# Patient Record
Sex: Female | Born: 1960 | Race: White | Hispanic: No | Marital: Single | Smoking: Current some day smoker
Health system: Southern US, Community
[De-identification: ages and names within clinical notes are randomized; demographics above are authoritative.]

## PROBLEM LIST (undated history)

## (undated) DIAGNOSIS — F419 Anxiety disorder, unspecified: Secondary | ICD-10-CM

## (undated) DIAGNOSIS — E079 Disorder of thyroid, unspecified: Secondary | ICD-10-CM

## (undated) DIAGNOSIS — F329 Major depressive disorder, single episode, unspecified: Secondary | ICD-10-CM

## (undated) DIAGNOSIS — F32A Depression, unspecified: Secondary | ICD-10-CM

## (undated) DIAGNOSIS — R569 Unspecified convulsions: Secondary | ICD-10-CM

## (undated) DIAGNOSIS — K219 Gastro-esophageal reflux disease without esophagitis: Secondary | ICD-10-CM

## (undated) DIAGNOSIS — M549 Dorsalgia, unspecified: Secondary | ICD-10-CM

## (undated) DIAGNOSIS — I10 Essential (primary) hypertension: Secondary | ICD-10-CM

## (undated) HISTORY — PX: ABDOMINAL HYSTERECTOMY: SHX81

## (undated) HISTORY — PX: MEDIAL PARTIAL KNEE REPLACEMENT: SHX5965

## (undated) HISTORY — PX: TONSILLECTOMY: SUR1361

## (undated) HISTORY — PX: APPENDECTOMY: SHX54

---
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
## 1999-02-26 ENCOUNTER — Emergency Department (HOSPITAL_COMMUNITY): Admission: EM | Admit: 1999-02-26 | Discharge: 1999-02-26 | Payer: Self-pay | Admitting: Emergency Medicine

## 1999-02-27 ENCOUNTER — Encounter: Payer: Self-pay | Admitting: Emergency Medicine

## 1999-08-06 ENCOUNTER — Encounter: Payer: Self-pay | Admitting: Emergency Medicine

## 1999-08-06 ENCOUNTER — Emergency Department (HOSPITAL_COMMUNITY): Admission: EM | Admit: 1999-08-06 | Discharge: 1999-08-06 | Payer: Self-pay | Admitting: Emergency Medicine

## 1999-09-05 ENCOUNTER — Inpatient Hospital Stay (HOSPITAL_COMMUNITY): Admission: EM | Admit: 1999-09-05 | Discharge: 1999-09-09 | Payer: Self-pay | Admitting: *Deleted

## 1999-09-12 ENCOUNTER — Other Ambulatory Visit: Admission: RE | Admit: 1999-09-12 | Discharge: 1999-10-19 | Payer: Self-pay | Admitting: Psychiatry

## 2000-02-10 ENCOUNTER — Emergency Department (HOSPITAL_COMMUNITY): Admission: EM | Admit: 2000-02-10 | Discharge: 2000-02-10 | Payer: Self-pay | Admitting: Emergency Medicine

## 2000-02-13 ENCOUNTER — Other Ambulatory Visit (HOSPITAL_COMMUNITY): Admission: RE | Admit: 2000-02-13 | Discharge: 2000-03-01 | Payer: Self-pay | Admitting: Psychiatry

## 2000-03-03 ENCOUNTER — Emergency Department (HOSPITAL_COMMUNITY): Admission: EM | Admit: 2000-03-03 | Discharge: 2000-03-03 | Payer: Self-pay | Admitting: Emergency Medicine

## 2000-03-14 ENCOUNTER — Ambulatory Visit (HOSPITAL_COMMUNITY): Admission: RE | Admit: 2000-03-14 | Discharge: 2000-03-14 | Payer: Self-pay | Admitting: Gastroenterology

## 2000-06-22 ENCOUNTER — Encounter: Payer: Self-pay | Admitting: Gastroenterology

## 2000-06-22 ENCOUNTER — Ambulatory Visit (HOSPITAL_COMMUNITY): Admission: RE | Admit: 2000-06-22 | Discharge: 2000-06-22 | Payer: Self-pay | Admitting: Gastroenterology

## 2002-03-17 ENCOUNTER — Emergency Department (HOSPITAL_COMMUNITY): Admission: EM | Admit: 2002-03-17 | Discharge: 2002-03-17 | Payer: Self-pay | Admitting: Emergency Medicine

## 2002-06-04 ENCOUNTER — Emergency Department (HOSPITAL_COMMUNITY): Admission: EM | Admit: 2002-06-04 | Discharge: 2002-06-04 | Payer: Self-pay | Admitting: Emergency Medicine

## 2004-05-03 ENCOUNTER — Emergency Department (HOSPITAL_COMMUNITY): Admission: EM | Admit: 2004-05-03 | Discharge: 2004-05-04 | Payer: Self-pay | Admitting: Emergency Medicine

## 2004-05-18 ENCOUNTER — Ambulatory Visit: Payer: Self-pay | Admitting: Psychiatry

## 2004-05-18 ENCOUNTER — Inpatient Hospital Stay (HOSPITAL_COMMUNITY): Admission: RE | Admit: 2004-05-18 | Discharge: 2004-05-20 | Payer: Self-pay | Admitting: Psychiatry

## 2004-06-08 ENCOUNTER — Emergency Department (HOSPITAL_COMMUNITY): Admission: EM | Admit: 2004-06-08 | Discharge: 2004-06-08 | Payer: Self-pay | Admitting: Emergency Medicine

## 2004-11-20 ENCOUNTER — Emergency Department (HOSPITAL_COMMUNITY): Admission: EM | Admit: 2004-11-20 | Discharge: 2004-11-20 | Payer: Self-pay | Admitting: Emergency Medicine

## 2004-12-06 ENCOUNTER — Emergency Department (HOSPITAL_COMMUNITY): Admission: EM | Admit: 2004-12-06 | Discharge: 2004-12-06 | Payer: Self-pay | Admitting: Emergency Medicine

## 2005-09-07 ENCOUNTER — Emergency Department (HOSPITAL_COMMUNITY): Admission: EM | Admit: 2005-09-07 | Discharge: 2005-09-08 | Payer: Self-pay | Admitting: Emergency Medicine

## 2005-09-08 ENCOUNTER — Inpatient Hospital Stay (HOSPITAL_COMMUNITY): Admission: EM | Admit: 2005-09-08 | Discharge: 2005-09-14 | Payer: Self-pay | Admitting: Emergency Medicine

## 2006-05-21 ENCOUNTER — Encounter: Admission: RE | Admit: 2006-05-21 | Discharge: 2006-05-21 | Payer: Self-pay | Admitting: Family Medicine

## 2008-01-28 IMAGING — CT CT HEAD W/O CM
1 of 2 series · 13 of 30 positions shown, 17 images · IV contrast (agent unspecified)
Comparison: none

CLINICAL DATA: Headache and blurred vision.  Left arm weakness.  Nausea.  Seizure disorder.  
 HEAD CT WITHOUT CONTRAST:
TECHNIQUE: Contiguous axial images were obtained from the base of the skull through the vertex according to standard protocol without contrast.

[Series 2: brain · axial · 0.47mm/px · z∈[+106,+230]mm · 13 of 28 slices shown, 17 images]
[im 2/28  brain]
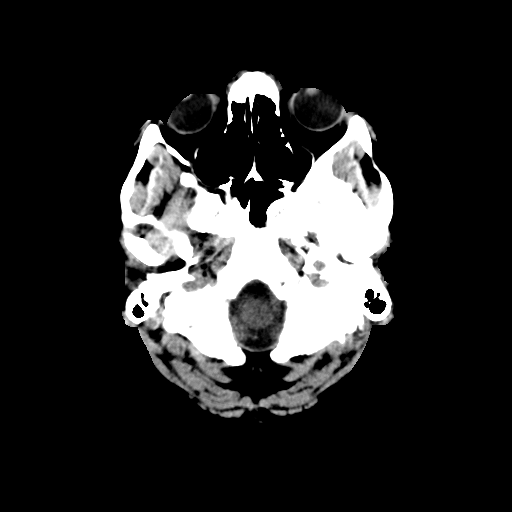
[im 2/28  bone]
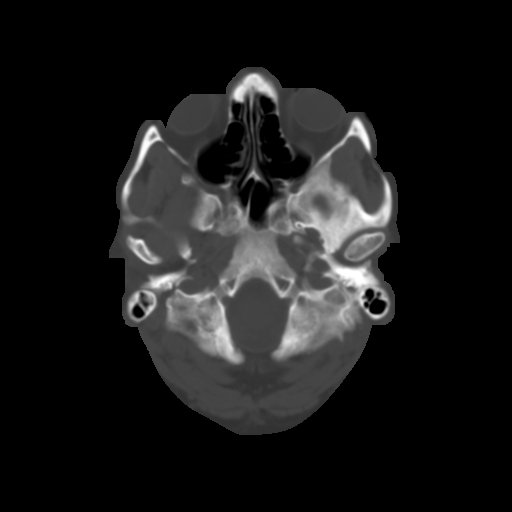
[im 4/28  brain]
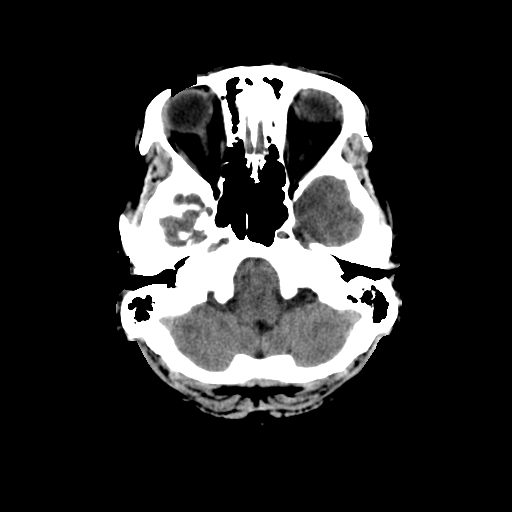
[im 6/28  brain]
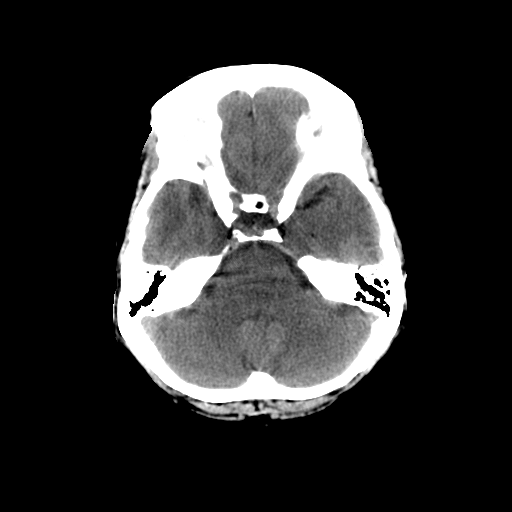
[im 8/28  brain]
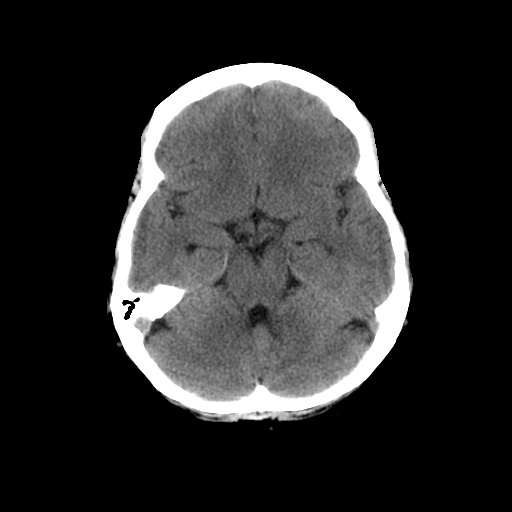
[im 10/28  brain]
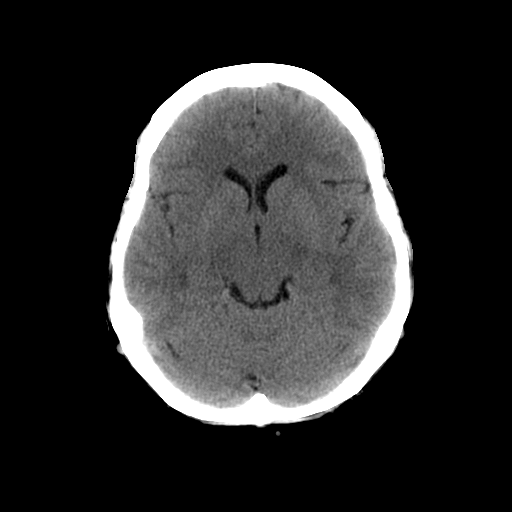
[im 10/28  bone]
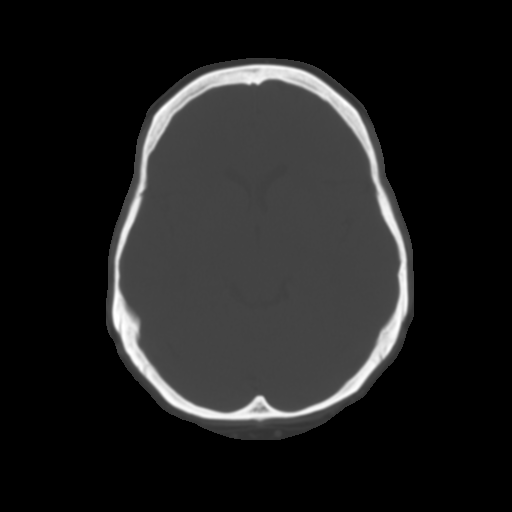
[im 12/28  brain]
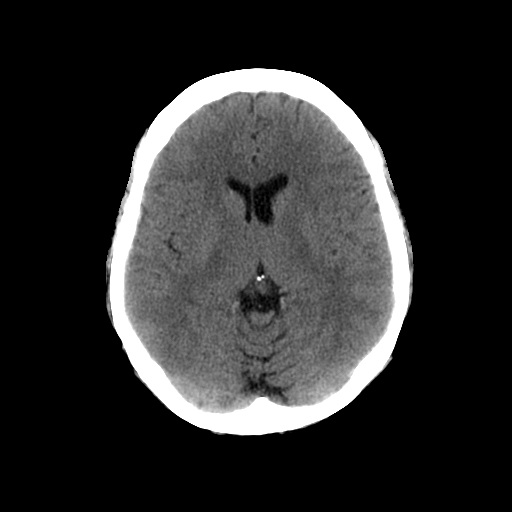
[im 14/28  brain]
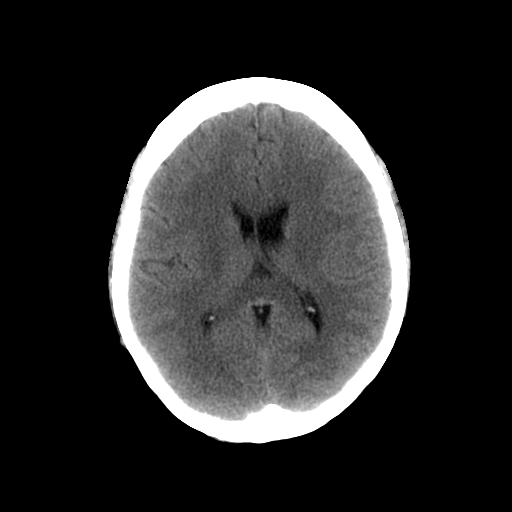
[im 16/28  brain]
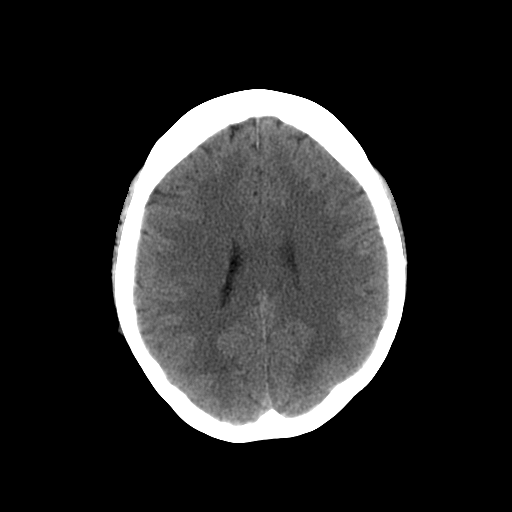
[im 18/28  brain]
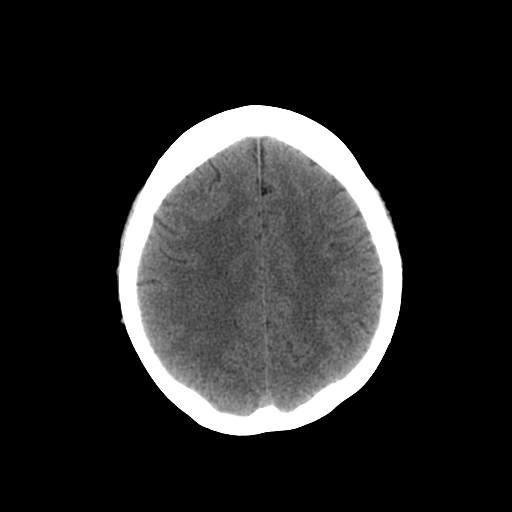
[im 18/28  bone]
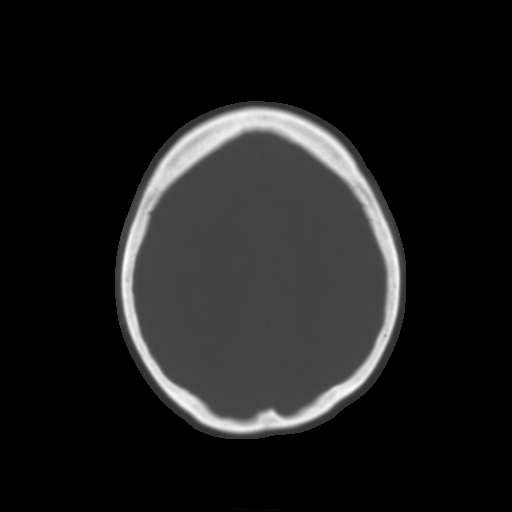
[im 20/28  brain]
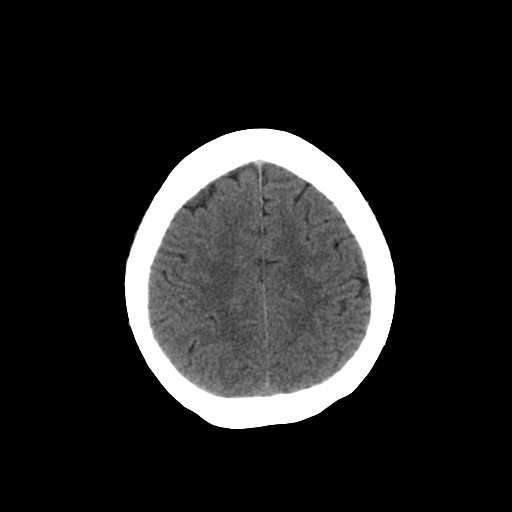
[im 22/28  brain]
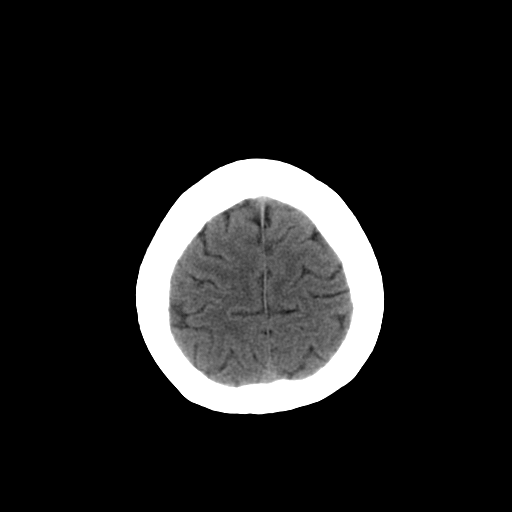
[im 24/28  brain]
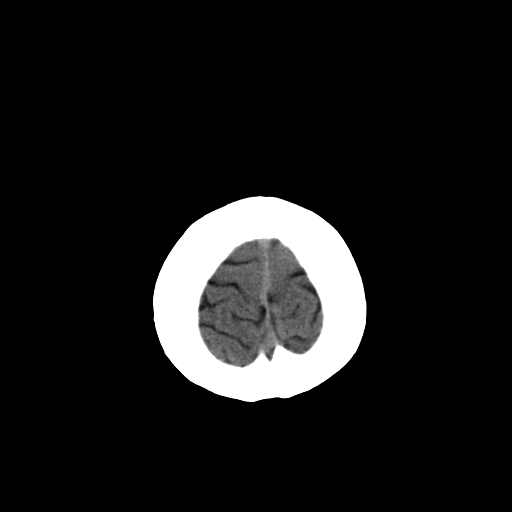
[im 26/28  brain]
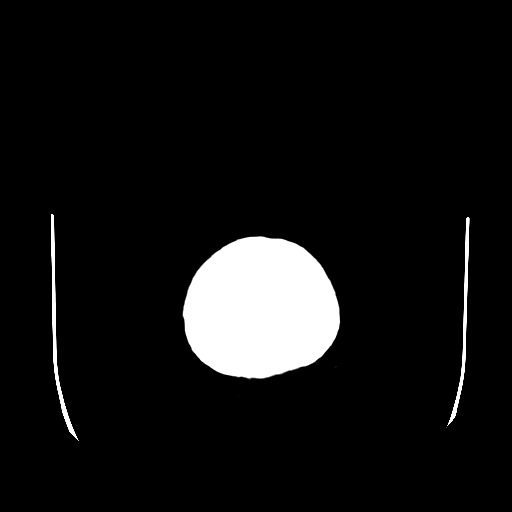
[im 26/28  bone]
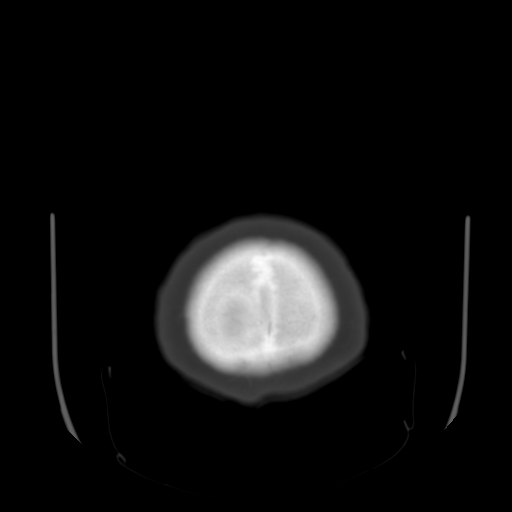

[13 of 30 positions shown; findings below may reference images not displayed]

FINDINGS: There is no evidence of intracranial hemorrhage, brain edema, acute 
 infarct, mass lesion, or mass effect.  No other intra-axial abnormalities 
 are seen, and the ventricles are within normal limits.  No abnormal 
 extra-axial fluid collections or masses are identified.  No skull 
 abnormalities are noted.
IMPRESSION: Negative non-contrast head CT.

## 2008-03-03 ENCOUNTER — Ambulatory Visit (HOSPITAL_BASED_OUTPATIENT_CLINIC_OR_DEPARTMENT_OTHER): Admission: RE | Admit: 2008-03-03 | Discharge: 2008-03-03 | Payer: Self-pay | Admitting: Urology

## 2008-03-03 ENCOUNTER — Encounter (INDEPENDENT_AMBULATORY_CARE_PROVIDER_SITE_OTHER): Payer: Self-pay | Admitting: Urology

## 2008-12-13 ENCOUNTER — Emergency Department (HOSPITAL_BASED_OUTPATIENT_CLINIC_OR_DEPARTMENT_OTHER): Admission: EM | Admit: 2008-12-13 | Discharge: 2008-12-13 | Payer: Self-pay | Admitting: Emergency Medicine

## 2008-12-13 ENCOUNTER — Ambulatory Visit: Payer: Self-pay | Admitting: Diagnostic Radiology

## 2010-05-31 NOTE — Op Note (Signed)
NAME:  [name], [name]                   ACCOUNT NO.:  [id number]   MEDICAL RECORD NO.:  [id number]          PATIENT TYPE:  AMB   LOCATION:  NESC                         FACILITY:  [facility]   PHYSICIAN:  Lindaann Slough, M.D.  DATE OF BIRTH:  [birth year]   DATE OF PROCEDURE:  03/03/2008  DATE OF DISCHARGE:                               OPERATIVE REPORT   PREOPERATIVE DIAGNOSES:  Pelvic pain and interstitial cystitis.   POSTOPERATIVE DIAGNOSES:  Pelvic pain and interstitial cystitis, meatal  stenosis.   PROCEDURE:  Meatal dilation and cystoscopy, HOD and bladder biopsy and  instillation of Marcaine and Pyridium in the bladder.   SURGEON:  Danae Chen, M.D.   ANESTHESIA:  General.   INDICATION:  The patient is a 50 years old female who has been  complaining of frequency, suprapubic discomfort, nocturia x2 to 3 and  occasional episodes of enuresis.  CT scan was unremarkable.  She is  scheduled today for cystoscopy, hydraulic bladder distention, bladder  biopsy and bladder instillation of Marcaine and Pyridium.   The patient was identified by her wrist band and proper time-out was  taken.   DESCRIPTION OF PROCEDURE:  Under general anesthesia she was prepped and  draped and placed in the dorsal lithotomy position.  A panendoscope  could not be inserted in the bladder because of meatal stenosis.  The  meatus was then dilated up to #30 [place] with female sounds then the  cystoscope was inserted without difficulty in the bladder.  The bladder  mucosa is reddened.  There is no stone or tumor in the bladder.  The  ureteral orifices are in normal position and shape with clear efflux.  The bladder was then distended for about 10 minutes under 36 cm of  water.  Then there was evidence glomerulations  after bladder  distention.  The bladder capacity is about 600 mL.  Then with a cold cup  biopsy forceps, a random bladder biopsy was done.  The areas of biopsy  were then fulgurated with the Bugbee  electrode.  The bladder was then  emptied and the cystoscope removed.  Then 400 mg of Pyridium in 15 mL of  0.5% Marcaine were instilled in the bladder.   The patient tolerated the procedure well and left the OR in satisfactory  condition to post anesthesia care unit.      Lindaann Slough, M.D.  Electronically Signed    MN/MEDQ  D:  03/03/2008  T:  03/03/2008  Job:  [id number]   cc:   Burna Mortimer, MD

## 2010-06-03 NOTE — H&P (Signed)
NAME:  [name], [name]                   ACCOUNT NO.:  [id number]   MEDICAL RECORD NO.:  [id number]          PATIENT TYPE:  INP   LOCATION:  1825                         FACILITY:  [facility]   PHYSICIAN:  Kela Millin, M.D.DATE OF BIRTH:  1960-12-22   DATE OF ADMISSION:  09/08/2005  DATE OF DISCHARGE:                                HISTORY & PHYSICAL   CHIEF COMPLAINT:  Persistent headache and neck stiffness.   HISTORY OF PRESENT ILLNESS:  The patient is a 50 year old white female who  presents with persistent headaches and neck pain.  She had presented to the  ER on the day prior to this admission with complaints of severe headache and  a workup including a CT scan of the head and lumbar puncture was done and  the patient was diagnosed with viral meningitis and was discharged home on  Dilaudid and Phenergan.  The patient states that she had also been having  some left arm numbness when she initially presented and a CT scan of her  head done in the ER was negative for any acute intracranial findings.  The  patient's lumbar puncture showed 18 WBCs with 93% lymphocytes per the ER  physician.  The patient returns to the ER today with similar complaints -  stating that the headache is just all over her head and feels like severe  pressure and that she also feels like her neck is stiff.  She states that  the Dilaudid she was discharged on made her itch and so she stopped taking  it.  She denies fevers, nausea, vomiting, dysphagia, slurred speech, facial  droop, chest pain, diarrhea, melena, and no hematochezia.   The patient was seen in the ER and [facility] Spotted Fever test from  August 23 was reported to be negative.  The Lyme test was also negative.  The patient has reported a question of a tick bite a couple of weeks ago.  She is admitted to the [facility] service for further evaluation and  management.   PAST MEDICAL HISTORY:  1. Bipolar disorder.  2. History of seizure  disorder.  3. History of suicidal ideations with BAC admission.  4. History of chronic back pain.   MEDICATIONS:  1. Lamictal 200 mg 2 p.o. q.h.s.  2. Inderal 60 mg 1 daily.  3. Seroquel 100 mg p.o. q.h.s.  4. Clonazepam 1 mg t.i.d. p.r.n.  5. Wellbutrin XL 150 mg daily.  6. Nexium 40 mg daily.  7. Dilaudid.  8. Phenergan.   ALLERGIES/REACTIONS:  ANTIHISTAMINES cause hives and itching, DILAUDID  causes itching.   SOCIAL HISTORY:  She states that she quit tobacco three weeks ago after  smoking for nine years, occasional alcohol.   FAMILY HISTORY:  Mother coronary artery disease in her 12s status post CABG.  Father heart disease diagnosed in early 65s and has depression.  Sister with  breast cancer and depression.  A brother with heart disease diagnosed at age  90, also had hypertension.   REVIEW OF SYMPTOMS:  As per HPI, other comprehensive review of systems  negative.  PHYSICAL EXAMINATION:  GENERAL:  The patient is a middle aged white female, alert and oriented in  no apparent distress.  Appears non-toxic.  VITAL SIGNS:  Temperature 97.5, blood pressure 126/83, pulse 72, respiratory  rate 20, O2 saturation 97%.  HEENT:  PERL, EOMI, sclerae anicteric, moist mucous membranes, no oral  exudate.  NECK:  Supple, there is no meningismus, no adenopathy, no thyromegaly.  LUNGS:  Clear to auscultation bilaterally, no crackles or wheezes.  CARDIOVASCULAR:  Normal S1 and S2, regular rate and rhythm.  ABDOMEN:  Soft, bowel sounds present, nontender, nondistended, no  organomegaly, no masses palpable.  EXTREMITIES:  No clubbing, no edema.  NEUROLOGICAL:  Alert and oriented x 3, cranial nerves 2-12 grossly intact.  Strength is 5/5 and symmetric.  Sensory is grossly intact.  Nonfocal exam.   LABORATORY DATA:  CSF colorless, 18 WBCs, 3 lymphocytes, CSF glucose 67, CSF  protein 41.  [facility] Spotted Fever test is negative.  CT scan of  head from August 23 negative for acute  intracranial findings.   ASSESSMENT/PLAN:  1. Probable viral meningitis - I will add VDRL to CSF fluid.  Start      empiric Acyclovir.  Follow pending Lyme test.  Follow with imaging      studies as appropriate.  Will also obtain serial HIV and follow.  2. Bipolar disorder - continue outpatient medications.  3. GERD - continue PPI.  4. History of seizure disorder - continue outpatient medications.      Kela Millin, M.D.  Electronically Signed     ACV/MEDQ  D:  09/08/2005  T:  09/08/2005  Job:  [id number]

## 2010-06-03 NOTE — H&P (Signed)
[facility]  Patient:    [name], [name]                            MRN: [id number] Adm. Date:  [id number] Disc. Date: [id number] Attending:  Denny Peon                   Psychiatric Admission Assessment  IDENTIFICATION:  Rolla Lovick is a 50 year old white separated female.  Patient was admitted for her first psychiatric admission.  REASON FOR ADMISSION:  Symptoms:  Patient was admitted for voluntary commitment with suicidal plan to overdose.  She felt being at the end of her rope.  Complains of severe middle, initial, and terminal insomnia, severe anxiety attacks, almost on a daily basis, crying spells, feeling hopeless, helpless, and having at times suicidal thoughts.  One month ago she took a bunch of Xanax tablets, but "nothing happened."  The recent stressors include death of father, who died in 18-Apr-2022 of last year, death of uncle in 09/18/22 less than a year ago.  Less than a year ago her husband left her for another woman. Patient lost her job, and she is on workmens compensation since October of 2000 due to back injury.  She used to work as a Psychologist, forensic.  She feels like there is no money, no future, no hope.  At the time of initial evaluation she still had some suicidal thoughts, but wanted to work it out. Patient reports a decrease in appetite and significant weight loss.  PAST PSYCHIATRIC HISTORY:  Patient was in 1995 for depression and most likely Paxil and Xanax with some help.  She was never hospitalized, but always has had " bad nerves."  SOCIAL HISTORY:  Patient is a high Garment/textile technologist.  She was with her husband since 29 or 6 years old.  Her childhood was troubled by neglect of her family home.  FAMILY HISTORY:  Significant for alcohol abuse and depression of the patients father and sister.  ALCOHOL AND DRUG HISTORY:  Patient smokes occasionally marijuana, maybe every 2-3 months.  She smokes a pack of cigarettes per day.  She  denies using any other drugs, denies drinking.  MEDICAL HISTORY:  Patient is under care of [facility].  She recently was treated for sinus infection.  Historically she had hysterectomy in the past.  CURRENT MEDICATIONS: 1. Paxil 30 mg daily. 2. Xanax 1 mg three times a day.  ALLERGIES:  Patient is allergic to ANTIHISTAMINES.  PHYSICAL EXAMINATION:  Normal in emergency room.  MENTAL STATUS EXAMINATION:  Revealed neat looking white female with tight facial expression, cooperative and quiet during the interview.  Normal speech. Mood was depressed and affect was tearful.  Thoughts were organized and goal directed.  She had suicidal thoughts with no intention to hurt herself.  She also felt hopeless.  No delusions, no ideas of reference, no homicidal thoughts.  No symptoms suggestive for OCD.  Alert and oriented x 3 with good memory, but impaired concentration.  She complains of having jumbled thoughts and having a hard time concentrating.  Patients insight was impaired, judgment fair.  DIAGNOSTIC IMPRESSION: Axis I:    Major depression, recurrent, moderate to severe. Axis II:   No diagnosis. Axis III:  Chronic back pain, status post hysterectomy. Axis IV:   Psychosocial stressors, severe, marital difficulties, economical            problems, and medical problems. Axis V:  Global assessment of functioning at the time of examination 25,            maximum for past year estimated at 55.  PLAN:  Will involve patient in group therapy, place her on special observation, increase dose of Paxil, continue Xanax, and add Zyprexa at bedtime because of racing thoughts.  Will consider referral to partial hospitalization program or intensive outpatient treatment after discharge. DD:  10/05/99 TD:  10/06/99 Job: [id number]

## 2010-06-03 NOTE — Discharge Summary (Signed)
NAME:  [name], [name]                   ACCOUNT NO.:  [id number]   MEDICAL RECORD NO.:  [id number]          PATIENT TYPE:  INP   LOCATION:  4712                         FACILITY:  [facility]   PHYSICIAN:  Kela Millin, M.D.DATE OF BIRTH:  December 14, 1960   DATE OF ADMISSION:  09/08/2005  DATE OF DISCHARGE:  09/14/2005                                 DISCHARGE SUMMARY   DISCHARGE DIAGNOSES:  1. Viral meningitis.  2. Candida vulvovaginitis.  3. Bipolar disorder.  4. History of seizure disorder.  5. History of chronic back pain.  6. History of suicidal ideation with BH, see admission.  7. Gastroesophageal reflux disease.   PROCEDURES AND STUDIES:  1. Blood patch.  2. CSF PCR negative and CSF VDRL negative.  Lyme disease test 0.06,      negative.  3. HIV test negative.  4. Serum RPR negative.   HISTORY:  The patient is a 50 year old white female who presented with  complaint of persistent headaches and neck pain.  She had been to the ER the  day prior to admission with complaint of severe headaches, and the workup  including a CT scan of the head and lumbar puncture was done.  The patient  was diagnosed with viral meningitis and discharged home on Dilaudid and  Phenergan.  The patient reported that she had been having some left arm  numbness as well when she had initially presented.  The CT of the head done  in the ER was negative for any acute intracranial findings.  The CSF from  the lumbar puncture that day showed 18 WBCs with 93% lymphocytes.  The  patient came back to the ER on the day of admission with complaint of  persistent headaches as well as a feeling of neck stiffness.  She reported  that the Dilaudid that she was discharged on had made her itch, and so she  stopped taking it.  She denied fevers, nausea, vomiting, dysphagia, slurred  speech, facial droop, chest pain, diarrhea, melena, and no hematochezia.   The patient was admitted to the [facility] service for  further  evaluation and management.  Her physical exam upon admission revealed a  temperature of 97.5 with a blood pressure of 126/83 and a pulse of 72,  respiratory rate of 20, O2 sat of 97%.  In general, she was a middle-aged  white female, alert and oriented, in no apparent distress, appeared  nontoxic.  Pertinent findings on exam:  Her neck was supple, no meningismus,  no adenopathy and no thyromegaly.  The rest of her physical exam was also  reported to be within normal limits.   LABORATORY DATA:  The CSF was colorless, with 18 WBCs, 93% lymphocytes.  CSF  glucose 67.  CSF protein 41.  [facility] spotted fever test negative.  CT scan of head from August 23 negative for acute intracranial findings.   PROBLEMS:  1. Viral meningitis.  Upon admission the patient had VDRL and herpes PCR      added to her CS fluid already in lab, and she was empirically started  on acyclovir.  She also had an HIV test done and the results as stated      above were negative.  The patient was also placed on analgesics for      pain control.  The patient's headaches seemed to improve initially, but      she had an increase in her creatinine from 1.3 to 1.5 and it was      thought that this was secondary to the NSAIDs, so they were      discontinued.  The patient's headaches began to get worse again.      Spinal headache was questioned.  The patient was sent for a blood patch      procedure.  The patient's headaches have improved significantly.  She      had been placed on lidocaine patch after the NSAIDs were discontinued,      and she said that she no longer needed the lidocaine patches.  The      patient's symptoms have improved.  She is afebrile and clinically      better.  She will be discharged home at this time to follow up with her      primary care physician  at [facility].  The IV acyclovir was      discontinued once her PCR returned negative.  2. Candidal vulvovaginitis.  The patient  complained of vaginal irritation      while she was in the hospital.  She was given a dose of Diflucan and      also placed on clotrimazole topically.  If her symptoms do not improve,      she is to follow up with her primary care physician.  3. GERD.  The patient was maintained on PPI during her hospital stay.  4. Bipolar disorder.  The patient was maintained on her outpatient      medications during her hospital stay.  5. History of seizure disorder.  She remained seizure frequent throughout      her hospital stay.  She was maintained on her outpatient medications.   DISCHARGE MEDICATIONS:  1. Vicodin.  2. Patient to continue preadmission medications except for Dilaudid and      Phenergan.   FOLLOWUP CARE:  [facility] in one week.   DISCHARGE CONDITION:  Improved/stable.      Kela Millin, M.D.  Electronically Signed     ACV/MEDQ  D:  09/14/2005  T:  09/14/2005  Job:  [id number]

## 2010-06-03 NOTE — Discharge Summary (Signed)
[facility]  Patient:    [name]                            MRN: [id number] Adm. Date:  [id number] Disc. Date: [id number] Attending:  Denny Peon                           Discharge Summary  INTRODUCTION:  [name] is a 50 year old white separated female, mother of two adult children.  She was admitted on voluntary papers because of suicidal plan to overdose on medication.  She complained of insomnia, anxiety, crying spells, feeling hopeless and helpless, having suicidal thoughts and not being able to promise safety.  The results of admission circumstances are available in the admission note.  HOSPITAL COURSE:  After admission to the [facility], the patient was place don special observation.  She was started on Paxil 30 mg at bedtime which later was switched to 40 mg daily, Xanax 1 mg three times a day.  Because of a sinus infection she was started on Amoxil 500 mg three times a day.  While in the hospital, I had to start her on Zyprexa 2.5 mg at bedtime due to racing thoughts and inability to fall asleep.  The 2.5 mg proved to be too strong a dose for this woman and dose was reduced to 1.5 mg at night with good result. as hospitalization proceeded, the patient revealed more historical data, suggestive for some signs of bipolarity.  I started her on Depakote ER, 500 mg at bedtime which the patient finds very helpful.  On September 09, 1999 she presented with a bright pleasant affect.  She had a good visit with her daughter last night and signals from home were that she was doing very well. The patient denies any dangerous ideations.  She did not have any side effects from medication and the decision was made to discharge her home with recommendation for outpatient treatment at partial hospitalization program.  MEDICAL PROBLEMS:  During this hospitalization, the patient did not have any medical problems except for a sinus infection which was treated.   Vital signs were stable.  The results of blood work show normal comprehensive metabolic panel, normal CBC, normal thyroid function tests.  DISCHARGE DIAGNOSES: Axis I:    Bipolar II disorder, depressed, moderate to severe. Axis II:   No diagnosis. Axis III:  1. Chronic back pain.            2. Status post hysterectomy.            3. Sinus infection. Axis IV:   Moderate stressors, of economical nature and medical problems. Axis V:    Global assessment of functioning upon admission 25, upon discharge            50, maximum 55.  DISCHARGE RECOMMENDATION:  The patient received a prescription for Paxil 40 mg to take in the morning and half at bedtime, Zyprexa 2.5 mg half at bedtime x 2 weeks then discontinue, Depakote ER, 500 mg one at night, Xanax 1 mg three times a day and Amoxil 500 mg three times a day for five days.  The patient will be excused from work at least until September 25, 1999.  The patient should call mental health if duration of depression, problems with medication. Will check blood work, especially Depakote level and liver panel in the next two weeks.  Follow up  on an outpatient basis at partial hospitalization program, starting Monday September 12, 1999 at 9 a.m. with future follow-up to be established by partial hospitalization staff.  The patient understood the instructions and possible side effects from medications, which were explained to her.  She was discharged in good condition in care of her family. DD:  09/09/99 TD:  09/11/99 Job: [id number]

## 2010-06-03 NOTE — Discharge Summary (Signed)
NAME:  [name], [name]                   ACCOUNT NO.:  [id number]   MEDICAL RECORD NO.:  [id number]          PATIENT TYPE:  IPS   LOCATION:  0504                          FACILITY:  [facility]   PHYSICIAN:  Jeanice Lim, M.D. DATE OF BIRTH:  [birth year]   DATE OF ADMISSION:  05/18/2004  DATE OF DISCHARGE:  05/20/2004                                 DISCHARGE SUMMARY   IDENTIFYING DATA:  This 50 year old divorced Caucasian female voluntarily  admitted, referred by primary care physician for possible suicide thoughts  and plan to overdose on medications, had been depressed and feeling lonely  for the past 6 months with some vague thoughts of overdosing off and on.  Endorsed anhedonia, lethargy, lying in bed 12 hours a day or more.  Broken  sleep, but denied agitation, had been put on Zyprexa for sleep 1 year ago.  She has gained 20 pounds.   PAST PSYCHIATRIC HISTORY:  [facility] admission.  At  [facility] in 1999.  IOP in 1999 for depression, no prior actual suicide  attempts, prior suicidality on Xanax at age 53 and 57.  Denied a history of  abuse.  Denied history of childhood abuse.  Divorced since 1999.  Trained as  a Lawyer.  Medicaid helps her afford medications.   MEDICATIONS:  1.  Paxil.  2.  Zyprexa.  3.  Prevacid.  4.  Tussionex.  5.  Wellbutrin.  6.  Klonopin.  7.  Xanax 0.5 twice a day.  8.  Dilantin 100 mg 2 in the morning and 3 at night.   PHYSICAL EXAMINATION:  Her physical and neurologic exam within normal  limits.   ROUTINE ADMISSION LABS:  Essentially within normal limits.   DRUG ALLERGIES:  ANTIHISTAMINES.   MENTAL STATUS EXAM:  Fully alert, bright affect, cooperative, pleasant.  Depressed.  Thought processes goal directed, logical.  Positive suicidal  ideations.  Contracting for safety only in the hospital.  Cognitively  intact.  Judgment and insight were impaired.   ADMISSION DIAGNOSES:   AXIS I:  Major depressive disorder, recurrent, severe.   AXIS II:  None.   AXIS III:  1.  Gastroesophageal reflux disease.  2.  Costochondritis.  3.  History of seizure disorder.   AXIS IV:  Moderate stressors related to limited support system.   AXIS V:  35/65.   HOSPITAL COURSE:  Patient was admitted, ordered routine p.r.n. medications,  underwent further monitoring, was encouraged to participate in individual,  group and milieu therapy.  Hemoglobin A1c, lipid panel, Dilantin level,  CMET, CBC and all other routine labs to rule out any reversible organic  etiology of her psychopathology and monitored safety and medications ordered  including baseline labs.  Patient felt that she did not need to stay that  she was not suicidal, and she was tolerating medications and mood had  improved.  She did not feel unsafe and ready for discharge with no acute  risk issues, no psychotic symptoms and tolerating medicines well.  Sleeping  and eating well.  Appropriate on the unit.  Patient was discharged  again  with improved mood stabilization and brighter affect, possibly improved  insight and coping skills.  Again given medication education regarding risk,  benefits and all side effects, significant and common.   DISCHARGE MEDICATIONS:  She was discharged on:  1.  Xanax 0.5 b.i.d.  2.  Dilantin 100 mg 2 in the morning and 3 at night.  3.  Paxil 20 mg 2 daily.  4.  Klonopin 1 mg q.a.m. and 2 mg q.h.s.  5.  Ambien 10 mg q.h.s.  6.  Protonix 40 mg daily.   FOLLOW UP:  Patient was discharged to follow up with [facility], walk-in to complete paperwork Monday to Friday 9:00 a.m. to 4:00  p.m.   DISCHARGE DIAGNOSES:   AXIS I:  Major depressive disorder, recurrent, severe.   AXIS II:  None.   AXIS III:  1.  Gastroesophageal reflux disease.  2.  Costochondritis.  3.  History of seizure disorder.   AXIS IV:  Moderate stressors related to limited support system.   AXIS V:  Global assessment of function on discharge 55.   Patient was  improved.      JEM/MEDQ  D:  06/26/2004  T:  06/26/2004  Job:  [id number]

## 2010-06-03 NOTE — H&P (Signed)
NAME:  [name], [name]                   ACCOUNT NO.:  [id number]   MEDICAL RECORD NO.:  [id number]          PATIENT TYPE:  IPS   LOCATION:  0504                          FACILITY:  [facility]   PHYSICIAN:  Jeanice Lim, M.D. DATE OF BIRTH:  1960-01-27   DATE OF ADMISSION:  05/18/2004  DATE OF DISCHARGE:                         PSYCHIATRIC ADMISSION ASSESSMENT   IDENTIFYING INFORMATION:  This is a 50 year old white female who is  divorced.  This is a voluntary admission.  She goes by the name of [name].   HISTORY OF PRESENT ILLNESS:  This patient was referred by her primary care  physician, Dr. Fleet Contras, at the [facility] in [place] she presented there for the first visit.  She went  there for complaints of chronic bronchitis and costochondritis but, at that  time, also described her chronic depression, which has persisted for the  past six months and her intermittent thoughts of possibly overdosing on her  medications.  At that point, she was referred for psychiatric admission.  She has a history of depression with initial onset five years ago.  Most  recently, over the past six months, she reports her depression has gotten  worse and endorses anhedonia, lethargy, laying in bed approximately 12+  hours a day but not sleeping well at night.  She denies any agitation.  Denies hallucinations.  Denies any homicidal thoughts.  She had some  occasional thoughts of overdosing on medications with the last episode being  about two weeks ago when her daughter got upset with her and they argued.  When asked how she coped with these thoughts, she said I called my daughter  back and we talked some more.  The patient was placed on Paxil 30 mg  approximately one year ago, which she takes regularly and, on Zyprexa 10 mg  for sleep.  She denies any history of thought agitation or hallucinations.  She does report gaining approximately 20 pounds in the last year since  starting on the Zyprexa.   PAST PSYCHIATRIC HISTORY:  The patient has no current psychiatric [name].  She is receiving medications on a prescription of a previous physician in  [place], probably actually more than one physician.  This is her first  admission to [facility].  She has a history of  being hospitalized and doing intensive outpatient clinic in 1999 here in  [place] at [facility] for onset of depression.  This was her first  onset at this time while she was going through a divorce.  She has no  history of prior suicide attempts.  Does report taking Xanax steadily from  age 65 to age 1 but denies that she ever abused it or overused it.  She  denies any history of abuse in childhood and denies any prior history of  substance abuse.   SOCIAL HISTORY:  This is a divorced white female, divorced since 1999.  Two  children in town.  The rest of her family is up in [place], which causes her  some emotional conflict, not being  sure where she preferred to live.  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  No  legal charges.  Works part-time in October.  She qualified for social  security disability based on her seizure disorder and history of depression.  She currently has Medicaid to help her pay for medications.  She lives alone  in her own apartment.   FAMILY HISTORY:  Father and sister with depression.   ALCOHOL/DRUG HISTORY:  The patient denies any current or past substance  abuse.   MEDICAL HISTORY:  The patient is currently followed by Dr. Concepcion Elk at [facility].  Medical problems include a history of seizures with the  last seizure being this past Wednesday.  Initially diagnosed in 1999 by a  neurologist in [place].  At that time, she was placed on Dilantin,  which she says she takes regularly and has missed no doses.  She is  currently status post episode of bronchitis with costochondritis  with some  sharp pains in her back beneath her scapula and patient has a history of  GERD.   MEDICATIONS:  Paxil 30 mg every morning for more than one year, Zyprexa 10  mg q.h.s., which she says she was placed on to help with her sleep more than  a year ago, Prevacid 30 mg daily, Tussionex every night, Wellbutrin XL 150  mg daily which she has been taking for approximately six months to a year,  Klonopin 0.5 mg, 2 in the morning, 4 at bedtime, Xanax 0.5 mg, 1 in the  morning and 1 q.h.s. and Dilantin 100 mg, 2 tablets in the morning and 3  tablets q.h.s.  She reports medication compliance and denies missing any.   ALLERGIES:  Some sensitivities to ANTIHISTAMINES.   POSITIVE PHYSICAL FINDINGS:  This is a well-nourished, well-developed and  healthy-appearing white female who is in no acute distress.   PAST MEDICAL HISTORY:  Hysterectomy in 1984, appendectomy in 1979, a hernia  operation in 1989 and repair of left knee ligaments in 1990.   REVIEW OF SYSTEMS:  Twenty-pound weight gain over the past year since she  started Zyprexa and she thinks about 15 pounds of that in the past three  months.  She has had bronchitis for the past three weeks and went to Dr.  Concepcion Elk complaining of some back pain when she took a deep breath.  She has  a negative chest x-ray yesterday at his office.  She has a history of some  irritable bowel syndrome with irregular bowel movements.  No fever or  chills.  No history of hypertension.  Chest pain.  She reports her sleep is  decreased and broken but admits that she is laying around in bed a lot.   PHYSICAL EXAMINATION:  This is a well-nourished, well-developed female who  weighs approximately 190 pounds, which is my estimate.  We do not have a  current accurate weight on her.  She is approximately 5 feet 4 inches tall.  Afebrile.  Pulse 113, respirations 18, blood pressure 146/97.  Fully alert and in no acute distress.  Hygiene is good.  She is appropriately  dressed  and groomed.  HEAD:  Normocephalic and atraumatic.  EENT:  PERRL.  Sclerae nonicteric.  No rhinorrhea.  Oropharynx in  satisfactory condition.  NECK:  Supple.  No thyromegaly.  CHEST:  Clear to auscultation.  BREASTS:  Exam is deferred.  CARDIOVASCULAR:  S1 and S2 is heard.  No  clicks, murmurs or gallops.  Radial  pulse is synchronous with apical pulse.  ABDOMEN:  Nontender and nondistended.  GENITOURINARY:  Deferred.  EXTREMITIES:  Without edema.  Pink and warm.  SKIN:  Clear.  No signs of self-mutilation.  She does have a tattoo of a  dolphin on her right ankle.  Scars to back up her claims of prior surgery.  NEUROLOGIC:  Cranial nerves 2-12 are intact. Extraocular movements are  normal.  No nystagmus.  Motor and facial symmetry is present.  Gait is  normal.  Romberg without findings.  Neuro is nonfocal.   LABORATORY DATA:  WBC 12.8, hemoglobin 13.1, hematocrit 37.9, platelets  240,000.  Chemistry with sodium 142, potassium 3.9, chloride 108, CO2 27,  BUN 10, creatinine 1.0, glucose 97 on the fasting specimen.  Liver enzymes  were all normal with normal total bilirubin, SGOT 23, SGPT 35, alkaline  phosphatase normal at 106.  Urinalysis and urine drug screen is currently  pending.  Her Dilantin was subtherapeutic at 5.6.  The patient denies  missing any dose.  This was in fact a trough level that was drawn at h.s.  immediately prior to her dose, h.s. dose of Dilantin.   MENTAL STATUS EXAM:  The patient is fully alert with a bright affect.  Cooperative and pleasant.  No affect blunting.  No signs of psychomotor  slowing.  Speech is normal in pace, tone and amount.  It is relevant.  Mood  is some depressed.  Thought process logical and coherent.  Thought process  is goal directed.  She is concerned about getting back to work and how much  time she is going to miss.  Positive for some vague suicidal thoughts,  primary passive.  She is able to contract for safety on the unit.   No  homicidal thoughts.  Cognitively, she is intact and oriented x 3.  No  paranoia.  No flight of ideas.  No auditory or visual hallucinations.  Intelligence within normal limits.  Insight is adequate.  Impulse control  and judgment within normal limits.   DIAGNOSES:   AXIS I:  Major depression, recurrent, severe.   AXIS II:  No diagnosis.   AXIS III:  1.  Seizure disorder.  2.  Obesity.  3.  Gastroesophageal reflux disease.  4.  Costochondritis secondary to bronchitis.   AXIS IV:  Moderate (stress with adjustment to divorce).   AXIS V:  Current 35; past year 85-75.   PLAN:  Voluntarily admit the patient with 15-minute checks in place, to  alleviate her suicidal thoughts.  We are going to check her hemoglobin A1C  on her lipid panel and repeat her Dilantin level in the morning.  Meanwhile,  we will discontinue her Wellbutrin due to her history of seizures. We are going to increase her Paxil to 40 mg daily since this did initially work  well for her when she was started on it more than a year ago.  Then, we will  also discontinue the Zyprexa because of her weight gain and its associated  risks and we have discussed this with her and we will start her on Lunesta  at night for sleep, 3 mg p.o. q.h.s.  The patient is in agreement with the  plan.  We are going to ask casemanagement to look into referring her for  both counseling and psychiatric follow-up.   ESTIMATED LENGTH OF STAY:  Five days.      MAS/MEDQ  D:  05/19/2004  T:  05/19/2004  Job:  [id number]

## 2010-06-03 NOTE — Procedures (Signed)
[facility]  Patient:    [name], [name]                       MRN: [id number] Proc. Date: 03/14/00 Adm. Date:  [id number] Attending:  Louie Bun CC:         Teena Irani. Arlyce Dice, M.D.                           Procedure Report  PROCEDURE:  Esophagogastroduodenoscopy.  INDICATIONS FOR PROCEDURE:  Dyspepsia refractory to Prevacid in a patient with a history of gastric ulcer and use of nonsteroidal anti-inflammatory drugs.  DESCRIPTION OF PROCEDURE:  The patient was placed in the left lateral decubitus position and placed on the pulse monitor with continuous low flow oxygen delivered by nasal cannula. She was sedated with 100 mg IV Demerol and 10 mg IV Versed. The Olympus video endoscope was advanced under direct vision into the oropharynx and esophagus. The esophagus was straight and of normal caliber at the squamocolumnar line at 38 cm. There was slight irregularity of the squamocolumnar line consistent with grade 1 esophagitis. There was no ring, stricture or visible hiatal hernia. The stomach was entered and a small amount of liquid secretions were suctioned from the fundus. Retroflexed view of the cardia was unremarkable. The fundus and body appeared normal. The antrum showed a few spots of erythema and granularity consistent with antral gastritis with no focal erosion. The pylorus was nondeformed and easily allowed passage of the endoscope tip into the duodenum. Both the bulb and second portion were well inspected and appeared to be within normal limits. The scope was withdrawn back into the stomach and a CLOtest obtained. The scope was then withdrawn and the patient returned to the recovery room in stable condition. The patient tolerated the procedure well and there were no immediate complications.  IMPRESSION: 1. Antral gastritis. 2. Minimal esophagitis.  PLAN: 1. Increase Prevacid to 30 mg b.i.d. 2. Avoid nonsteroidal anti-inflammatory  drugs. 3. Await CLOtest and treat for eradication of Helicobacter if positive. 4. Consider a gallbladder ultrasound if symptoms do not respond to the    above outlined management. DD:  03/14/00 TD:  03/15/00 Job: [id number]

## 2010-06-03 NOTE — Discharge Summary (Signed)
[facility]  Patient:    [name], [name]                            MRN: [id number] Adm. Date:  [id number] Disc. Date: [id number] Attending:  Denny Peon                           Discharge Summary  DATE OF ADMISSION:  September 05, 1999  PATIENT IDENTIFICATION:  [name] is a 50 year old white separated female who was admitted per voluntary paper due to increase of depressive symptoms and suicide attempt to overdose.  HISTORY OF PRESENT ILLNESS:  For details of patient history and situation prior to admission are available in admission notes.  INITIAL PLAN OF CARE:  [facility], patient was placed on special observation.  I increased Paxil from 30 to 40 mg daily and started her on Zyprexa 2.5 mg at bedtime.  She also had some symptoms suggestive for gastritis for which reason Prilosec 20 mg daily was introduced.  On August 22, patient slept well, had decreasing racing thoughts but she felt somehow ______ and therefore Zyprexa dose was decreased, at the time she already denied having suicidal thoughts.  On August 23, patient had recurrence of her death wishes and hopelessness.  I feel that she could hurt herself if discharged.  Further, she reports "swinging moods" even before antidepressant was introduced.  With decreasing racing thoughts and patient is not psychotic, I feel that it may be complement of bipolarity in patient presentation.  I added Depakote ER 500 mg at bedtime with plan to discharge her the next day if stable.  On August 24, patient felt much better and presented with bright affect, no dangerous ideations, reports no longer racing thoughts, improved sleep and appetite.  She complains of lower back pain but otherwise she was doing very well.  She had a visit with her daughter the night before which went well.  Her caseworker tried to contact family.  The patient felt safe to be discharged with the recommendation to  follow up with partial hospitalization program.  MEDICAL PROBLEMS:  During this hospitalization, patients vital signs were stable with blood pressure 124/80, pulse 80, temperature 98, normal respiration rate.  Patients weight on admission was 208 pounds, height 5 feet 8 inches.  LABORATORIES:  Results of her blood work:  Her CBC was normal.  Liver function tests normal.  Thyroid function tests normal.  Chemistry-17 normal.  DISCHARGE DIAGNOSES: Axis I:    Major depression, recurrent, moderate to severe; rule out bipolar            II depressed. Axis II:   None. Axis III:  Chronic back pain, status post hysterectomy. Axis IV:   Moderate stressor. Axis V:    Global Assessment of Functioning upon admission 25, upon discharge            45, maximum for past year estimated at55.  DISCHARGE RECOMMENDATIONS:  Patient was referred for further treatment and partial hospitalization program.  She should check lab work, Depakote level, and liver panel within next two weeks.  Her medication may be adjusted throughout partial.  She was to call with any recurrence of suicidal thoughts, problems with medications, etc.  Patient was excused from work until September 9.  She may return to work on September 10.  Prescription was written for Paxil 40 mg one-half tablet twice  a day, Zyprexa 2.5 mg one-half tablet at bedtime x 2 weeks and then discontinue, Depakote ER 500 mg at night, and Xanax 1 mg three times a day.  Patient is supposed to continue Amoxil 500 mg every eight hours for next five days and then discontinue for a sinus infection. Patient understood the instructions.  Side effects of medication were explained to her.  She was discharged in good condition in care of her family. DD:  10/05/99 TD:  10/06/99 Job: [id number]

## 2010-06-03 NOTE — H&P (Signed)
[facility]  Patient:    [name], [name]                            MRN: [id number] Adm. Date:  [id number] Attending:  Denny Peon                   Psychiatric Admission Assessment  DATE OF ADMISSION:  September 06, 1999.  HISTORY OF PRESENT ILLNESS:  [name] is a 50 year old white, separated female, mother of two, adult children.  The patient was admitted on voluntary papers with suicidal plan to overdose.  She feels being at the end of her rope.  Complains of insomnia, anxiety attacks, crying spells, feeling hopeless,  helpless, and having recurrent suicidal thoughts.  A few weeks ago she took a bunch of Xanax tablets but she fell asleep and "nothing else happened."  The factors which brought up current depression includes death of patients father in 2022-03-29 and death of uncle in 08-29-2022 of last year.  Less than a year ago her husband left her for another woman.  In the meantime, the patient lost her job and she is on workmens compensation since October of last year due to back pain and other medical problems.  The patient got herself into mind set of not having a way out this situation, where she has no money, no future and no present.  At the time of admission she was still having suicidal thoughts but wanted to work out her difficulties.  The patient also complains of weight loss and loss of appetite for the past several months.  PAST PSYCHIATRIC HISTORY:  The patient has been treated with antidepressants since 1995, mostly Paxil and Xanax.  She was never hospitalized and does not remember ever feeling as bad as at present.  SOCIAL HISTORY:  The patient has a 12th grade education.  She was on and off with her husband since the age of 79.  She worked for years as a Education administrator.  She was reluctant to talk about her childhood but seems like there was some neglect in her situation.  FAMILY HISTORY:  Significant for the passing of the  patients father and patients sister.  ALCOHOL AND DRUG HISTORY:  The patient denies using alcohol.  She smokes cigarettes, one-pack per day once every two or three months.  SUBSTANCE ABUSE HISTORY:  She has tried marijuana to "relax."  She has not used this recently.  PAST MEDICAL HISTORY:  The patient is under the medical care of [facility].  At present, she has problems with sinus infections and she was just started on an antibiotic.  Medically, she does not have any major medical problems with the exception of chronic back pain and having a hysterectomy in the past.  MEDICATIONS AT THE TIME OF ADMISSION:  The patient was on Paxil 30 mg daily and Xanax 1 mg three times a day.  ALLERGIES:  She is allergic to ANTIHISTAMINES.  PHYSICAL EXAMINATION:  Physical examination in the emergency room was normal. Also, a few days ago she had a physical examination in the doctors office.  MENTAL STATUS EXAMINATION:  Revealed a neat looking white female with sad facial expression.  She was cooperative but quiet throughout the interview. Speech was normal.  Mood was depressed.  Affect tearful.  Thoughts were organized and goal directed.  She had some suicidal thoughts with vague plans feeling hopeless and helpless.  No  homicidal thoughts.  No delusions.  No symptoms of obsessive-compulsive disorder.  Alert and oriented x 3 with good memory but decreased concentration.  She complains of having "jumbled thoughts."  Intelligence normal.  Insight and judgment fair.  She ______ since here.  DIAGNOSTIC IMPRESSION: Axis I:    Major depression, recurrent, moderate to severe. Axis II:   No diagnosis. Axis III:  Chronic back pain and status post hysterectomy. Axis IV:   Moderate stressors ______ problem, medical problem, and            family support problems. Axis V:    GAF on admission 25, for past year maximum 55.  DISCUSSION AND RECOMMENDATIONS:  Will admit the patient to the  unit.  Start her on Zyprexa at 2.5 mg at bedtime to decrease racing thoughts, mental agitation and help her sleep.  Consider past hospitalization program.  Upon discharge we will increase Paxil to 40 mg a daily and enroll patient in group therapy to allow her to cope better with her problems. DD:  09/07/99 TD:  09/08/99 Job: [id number]

## 2010-06-03 NOTE — Discharge Summary (Signed)
[facility]  Patient:    [name], [name]                            MRN: [id number] Adm. Date:  [id number] Disc. Date: [id number] Attending:  Denny Peon                           Discharge Summary  INTRODUCTION:  Caycee Wanat is a 50 year old white separated female who lives with her older daughter and grandchildren.  She was admitted on voluntary papers with suicidal plan to overdose, complaining at being at the end of the rope.  Details of patients history are available in the admission note.  HOSPITAL COURSE:  After admitting to the [facility], the patient was started back on Paxil which was increased from 20 mg daily to 40 mg daily.  She was also from 70 mg to 40 mg daily.  She was also started on Zyprexa 2.5 mg at bedtime, targeting symptoms of racing and insomnia.  Because of problems with stomach, I started her on Prilosec 20 mg daily.  On September 07, 1999 she presented with somewhat brighter affect but felt too drugged on 2.5 mg Zyprexa.  The racing thoughts were decreased.  The Zyprexa was decreased to 1.25 mg.  On August 23, there was some recurrence of death wish and feelings of hopelessness.  The patient reported swinging of mood even before antidepressant was introduced. I started her on Depakote ER 500 mg at bedtime.  On the day of discharge the patients condition was much improved.  She presented with bright affect, absence of dangerous ideas, no longer with racing thoughts.  Sleep was improved.  She complains of some back pain but otherwise is doing well.  She felt that she was ready for discharge and a visit with her daughter went well. There were no side effects from medication, especially from Depakote recently introduced.  MEDICAL PROBLEMS:  During the brief hospital stay the patient did not develop any medical problems.  She was taking Amoxil for symptoms of urinary tract infection even prior to admission and Amoxil was  continued.  LABORATORY DATA:  Results of blood work and additional blood results:  Normal results of sodium, glucose, potassium, chloride, BUN, bicarbonate.  Hematocrit was 37 and hemoglobin was 13, within normal limits.  Drug screen was negative for substances of abuse.  Additional blood work showed normal thyroid function tests, normal chemistry 17.  Vital signs throughout this brief hospitalization were within normal limits.  DISCHARGE DIAGNOSES: Axis I:    1. Major depression, moderate to severe, recurrent.            2. Rule out bipolar disorder II. Axis II:   No diagnosis. Axis III:  1. Chronic back pain.            2. Status post hysterectomy.            3. History of urinary tract infection. Axis IV:   Moderate stressors, family problems, medical problems and economic            problems. Axis V:    Global assessment of functioning on admission 25, maximum for past            year 55, on discharge 55.  DISCHARGE RECOMMENDATIONS: 1. The patient was discharged on Paxil 40 mg 1/2 tablet in the morning and 1.2    tablet at bedtime,  Zyprexa 2.5 mg 1/2 at bedtime x 2 weeks then patient may    discontinue, Depakote ER 500 mg at night, Xanax 1 mg three times a day and    Amoxil 500 mg three times a day x 5 days then discontinue. 2. She is excused from work until September 25, 1999 and may return to work on    September 26, 1999. 3. She will call mental health if duration or depression or problems with    medications. 4. She will check blood work, especially Depakote level and liver panel in two    weeks.  Side effects of medication were explained to the patient in detail. 5. The patient is to follow in intensive outpatient treatment beginning Monday    September 12, 1999 at 9 a.m. DD:  10/26/99 TD:  10/27/99 Job: [id number]

## 2011-05-05 IMAGING — CR DG CHEST 2V
2 series · 2 of 2 positions shown · non-contrast
Comparison: 09/07/2005

CLINICAL DATA: Cough.  Chest congestion.

CHEST - 2 VIEW

[w chest pa]
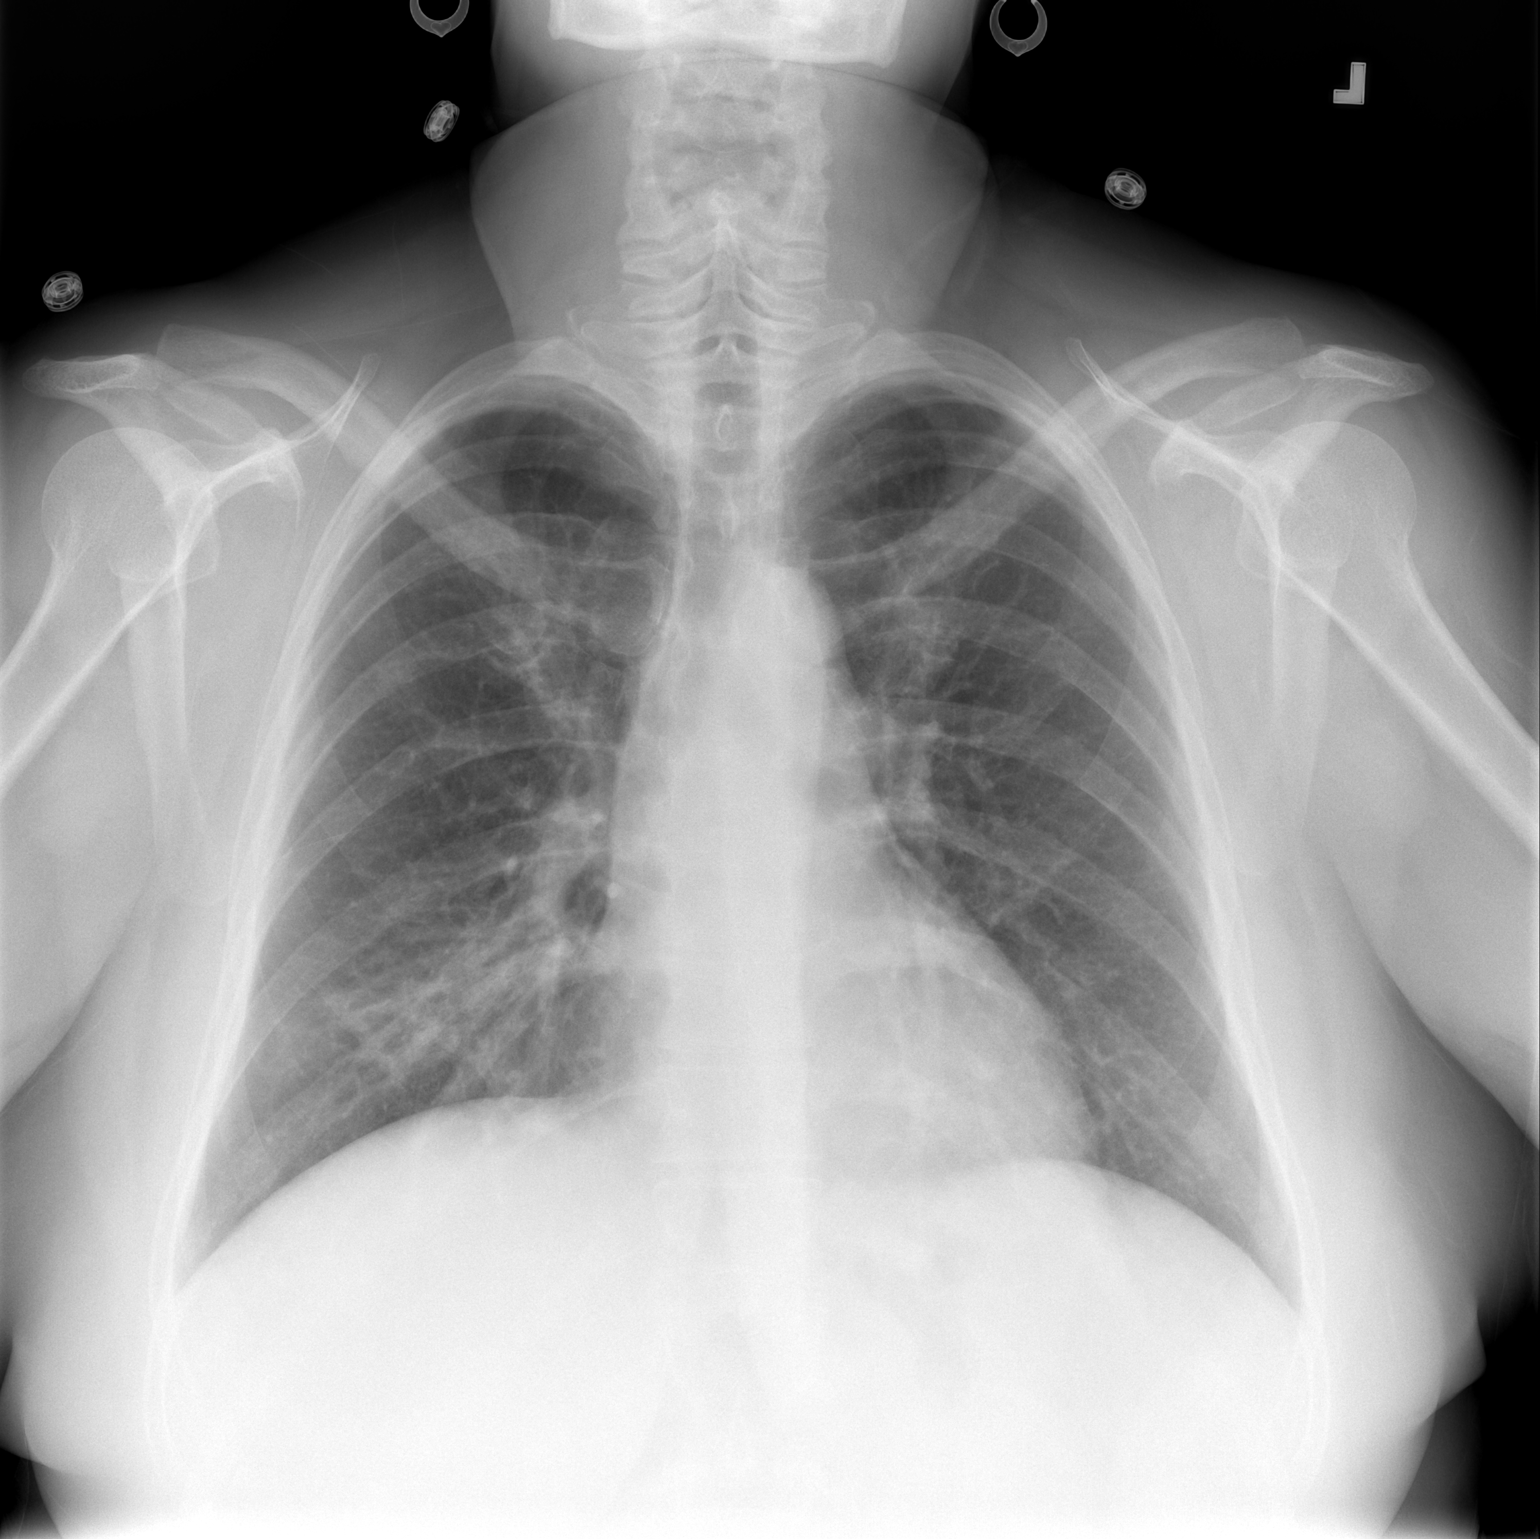

[w chest lat]
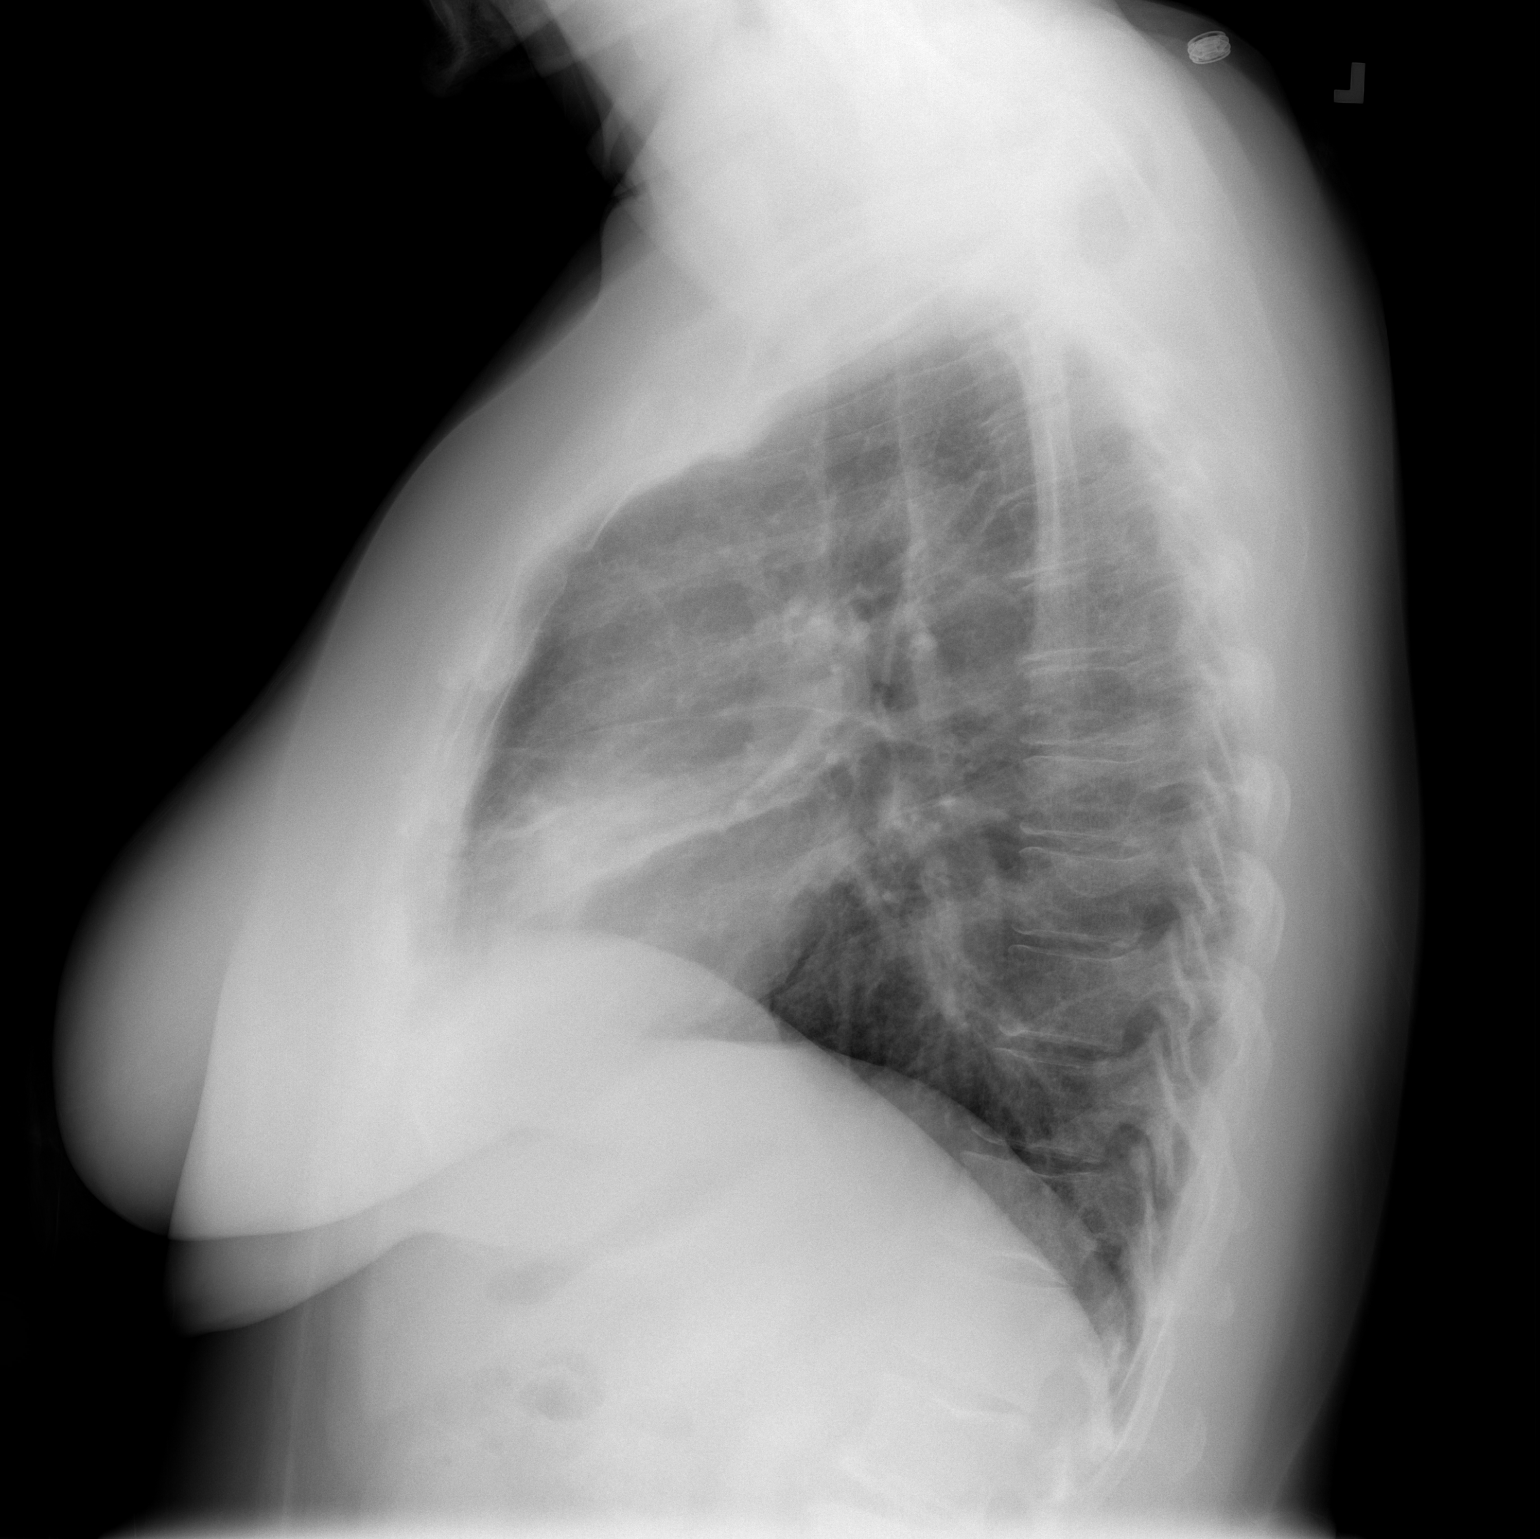

[2 of 2 positions shown; findings below may reference images not displayed]

FINDINGS: New right middle lobe infiltrate is seen, consistent with
pneumonia.  Left lung is clear.  There is no evidence of pleural
effusion.  Heart size is normal.  No definite hilar mediastinal
mass identified.
IMPRESSION: Right middle lobe infiltrate, consistent with pneumonia.  Post-
treatment chest radiographic followup is recommended to confirm
resolution.

## 2015-11-30 ENCOUNTER — Encounter (HOSPITAL_COMMUNITY): Payer: Self-pay | Admitting: Psychiatry

## 2015-11-30 ENCOUNTER — Ambulatory Visit (INDEPENDENT_AMBULATORY_CARE_PROVIDER_SITE_OTHER): Payer: Medicaid Other | Admitting: Psychiatry

## 2015-11-30 VITALS — BP 128/72 | HR 80 | Resp 16 | Ht 68.0 in | Wt 258.0 lb

## 2015-11-30 DIAGNOSIS — F3130 Bipolar disorder, current episode depressed, mild or moderate severity, unspecified: Secondary | ICD-10-CM | POA: Diagnosis not present

## 2015-11-30 DIAGNOSIS — Z79899 Other long term (current) drug therapy: Secondary | ICD-10-CM | POA: Diagnosis not present

## 2015-11-30 DIAGNOSIS — F411 Generalized anxiety disorder: Secondary | ICD-10-CM | POA: Diagnosis not present

## 2015-11-30 DIAGNOSIS — F17210 Nicotine dependence, cigarettes, uncomplicated: Secondary | ICD-10-CM | POA: Diagnosis not present

## 2015-11-30 DIAGNOSIS — Z888 Allergy status to other drugs, medicaments and biological substances status: Secondary | ICD-10-CM

## 2015-11-30 DIAGNOSIS — F5102 Adjustment insomnia: Secondary | ICD-10-CM

## 2015-11-30 MED ORDER — BUSPIRONE HCL 7.5 MG PO TABS: 7.5000 mg | ORAL_TABLET | Freq: Two times a day (BID) | ORAL | 0 refills | Status: DC
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
## 2015-12-24 ENCOUNTER — Ambulatory Visit (HOSPITAL_COMMUNITY): Payer: Self-pay | Admitting: Psychiatry

## 2016-01-18 ENCOUNTER — Encounter (HOSPITAL_COMMUNITY): Payer: Self-pay | Admitting: Psychiatry

## 2016-01-24 ENCOUNTER — Ambulatory Visit (HOSPITAL_COMMUNITY): Payer: Self-pay | Admitting: Psychiatry

## 2016-02-02 ENCOUNTER — Ambulatory Visit (HOSPITAL_COMMUNITY): Payer: Self-pay | Admitting: Psychiatry

## 2016-02-11 ENCOUNTER — Ambulatory Visit (INDEPENDENT_AMBULATORY_CARE_PROVIDER_SITE_OTHER): Payer: Medicaid Other | Admitting: Psychiatry

## 2016-02-11 ENCOUNTER — Encounter (HOSPITAL_COMMUNITY): Payer: Self-pay | Admitting: Psychiatry

## 2016-02-11 DIAGNOSIS — Z888 Allergy status to other drugs, medicaments and biological substances status: Secondary | ICD-10-CM

## 2016-02-11 DIAGNOSIS — F17210 Nicotine dependence, cigarettes, uncomplicated: Secondary | ICD-10-CM | POA: Diagnosis not present

## 2016-02-11 DIAGNOSIS — F411 Generalized anxiety disorder: Secondary | ICD-10-CM | POA: Diagnosis not present

## 2016-02-11 DIAGNOSIS — F3130 Bipolar disorder, current episode depressed, mild or moderate severity, unspecified: Secondary | ICD-10-CM

## 2016-02-11 DIAGNOSIS — Z79899 Other long term (current) drug therapy: Secondary | ICD-10-CM | POA: Diagnosis not present

## 2016-02-11 DIAGNOSIS — F5102 Adjustment insomnia: Secondary | ICD-10-CM

## 2016-02-11 MED ORDER — BUSPIRONE HCL 15 MG PO TABS: 15.0000 mg | ORAL_TABLET | Freq: Two times a day (BID) | ORAL | 0 refills | Status: DC

## 2016-02-11 MED ORDER — ARIPIPRAZOLE 10 MG PO TABS: 10.0000 mg | ORAL_TABLET | Freq: Every day | ORAL | 0 refills | Status: DC

## 2016-02-11 MED ORDER — QUETIAPINE FUMARATE 300 MG PO TABS: 300.0000 mg | ORAL_TABLET | Freq: Every day | ORAL | 0 refills | Status: DC

## 2016-02-11 NOTE — Progress Notes (Signed)
[facility]  Patient Identification: Courtney SableDawn L Schroeder MRN:  [id number] Date of Evaluation:  02/11/2016 Referral Source: Primary care Chief Complaint:   Chief Complaint    Follow-up     Visit Diagnosis:    ICD-9-CM ICD-10-CM   1. Bipolar I disorder, most recent episode depressed (HCC) 296.50 F31.30   2. GAD (generalized anxiety disorder) 300.02 F41.1   3. Adjustment insomnia 307.41 F51.02     History of Present Illness:  56 years old currently single Caucasian female initially referred by primary care physician for management of bipolar and anxiety disorder She does not want to go to Dr. Lloyd HugerNeil to [place] because of distance and need a local [place] office  Patient also suffers from seizures last visit the added BuSpar for anxiety she has been on Klonopin in the past but last use was 3 months ago she was in a higher dose of Klonopin in the remote past Mood wise she is doing reasonable does not endorse hopelessness but anxiety was she does have nervousness some mild tremors. She believes Abilify low dose did not help the depression side was increased to 10 mg in the past. Sleep remains problematic at times but fluctuates depending on anxiety level at night   Aggravating factor:finance, lonliness. Seizures history Modifying factor: daughter, goes to walk   Severity of depression: 6.5/10 Severity of anxiety: 4.5 /10. 10 being extreme anxiety     Past Psychiatric History: 2 admissions in 1997/98 for severe depression .was going thru divorce  Previous Psychotropic Medications: Yes     Family Psychiatric History: Father and sister : bipolar  Family History: History reviewed. No pertinent family history.  Social History:   Social History   Social History  . Marital status: Divorced    Spouse name: N/A  . Number of children: N/A  . Years of education: N/A   Social History Main Topics  . Smoking status: Current Some Day Smoker    Packs/day: 1.00    Years:  20.00    Types: Cigarettes  . Smokeless tobacco: Never Used  . Alcohol use No  . Drug use: No  . Sexual activity: Not Currently   Other Topics Concern  . None   Social History Narrative  . None      Allergies:   Allergies  Allergen Reactions  . Oxycodone Anxiety and Other (See Comments)    Pt states it makes her very hot and anxious/nervous.    Metabolic Disorder Labs: No results found for: HGBA1C, MPG No results found for: PROLACTIN No results found for: CHOL, TRIG, HDL, CHOLHDL, VLDL, LDLCALC   Current Medications: Current Outpatient Prescriptions  Medication Sig Dispense Refill  . albuterol (PROVENTIL) (2.5 MG/3ML) 0.083% nebulizer solution 2.5 mg.    . ARIPiprazole (ABILIFY) 10 MG tablet Take 1 tablet (10 mg total) by mouth daily. 30 tablet 0  . busPIRone (BUSPAR) 15 MG tablet Take 1 tablet (15 mg total) by mouth 2 (two) times daily. 60 tablet 0  . HYDROcodone-acetaminophen (NORCO/VICODIN) 5-325 MG tablet Take by mouth.    . lamoTRIgine (LAMICTAL) 200 MG tablet Take 200 mg by mouth.    . levothyroxine (SYNTHROID, LEVOTHROID) 137 MCG tablet Take by mouth.    . pantoprazole (PROTONIX) 20 MG tablet Take 20 mg by mouth.    . QUEtiapine (SEROQUEL) 300 MG tablet Take 1 tablet (300 mg total) by mouth at bedtime. 30 tablet 0  . simvastatin (ZOCOR) 80 MG tablet Take 80 mg by mouth.  No current facility-administered medications for this visit.       Psychiatric Specialty Exam: Review of Systems  Respiratory: Negative for cough.   Cardiovascular: Negative for palpitations.  Neurological: Positive for tremors.  Psychiatric/Behavioral: Negative for substance abuse and suicidal ideas. The patient is nervous/anxious.     There were no vitals taken for this visit.There is no height or weight on file to calculate BMI.  General Appearance: Casual  Eye Contact:  Fair  Speech:  Normal Rate  Volume:  Decreased  Mood: euthymic but somewhat anxious  Affect:  Constricted   Thought Process:  Goal Directed  Orientation:  Full (Time, Place, and Person)  Thought Content:  Rumination  Suicidal Thoughts:  No  Homicidal Thoughts:  No  Memory:  Immediate;   Fair Recent;   Fair  Judgement:  Fair  Insight:  Shallow  Psychomotor Activity:  Normal  Concentration:  Concentration: Fair and Attention Span: Fair  Recall:  [employer] of Knowledge:Fair  Language: Fair  Akathisia:  Negative but has some tremors   Handed:  Right  AIMS (if indicated):    Assets:  Desire for Improvement Social Support  ADL's:  Intact  Cognition: WNL  Sleep:  Fair     Treatment Plan Summary: Medication management and Plan as follows   Bipolar disorder; depressed: not worsened. Continue abilify, seroquel  Will send refills. Also on lamictal for seizures GAD: not improved. Will increase buspar to 15mg  bid InsomniaL: fluctuates. Reviewed sleep hygiene. Also on seroquel   Once her anxiety is somewhat in control will evaluate tremors again if needed cut down on one of the 2 most stabilizers Follow up closely with her neurologist and primary care for seizure disorder, thyroid  And other medical concerns  Follow up in 4 weeks or early if needed.       Thresa Ross, MD 1/26/201812:36 PM

## 2016-03-10 ENCOUNTER — Ambulatory Visit (HOSPITAL_COMMUNITY): Payer: Self-pay | Admitting: Psychiatry

## 2016-03-13 ENCOUNTER — Ambulatory Visit (HOSPITAL_COMMUNITY): Payer: Self-pay | Admitting: Psychiatry

## 2016-03-20 ENCOUNTER — Ambulatory Visit (INDEPENDENT_AMBULATORY_CARE_PROVIDER_SITE_OTHER): Payer: Medicaid Other | Admitting: Psychiatry

## 2016-03-20 ENCOUNTER — Encounter (HOSPITAL_COMMUNITY): Payer: Self-pay | Admitting: Psychiatry

## 2016-03-20 VITALS — BP 130/80 | HR 93 | Resp 18 | Ht 68.0 in | Wt 264.0 lb

## 2016-03-20 DIAGNOSIS — Z818 Family history of other mental and behavioral disorders: Secondary | ICD-10-CM | POA: Diagnosis not present

## 2016-03-20 DIAGNOSIS — F5102 Adjustment insomnia: Secondary | ICD-10-CM | POA: Diagnosis not present

## 2016-03-20 DIAGNOSIS — F3130 Bipolar disorder, current episode depressed, mild or moderate severity, unspecified: Secondary | ICD-10-CM | POA: Diagnosis not present

## 2016-03-20 DIAGNOSIS — Z888 Allergy status to other drugs, medicaments and biological substances status: Secondary | ICD-10-CM

## 2016-03-20 DIAGNOSIS — F17210 Nicotine dependence, cigarettes, uncomplicated: Secondary | ICD-10-CM

## 2016-03-20 DIAGNOSIS — F411 Generalized anxiety disorder: Secondary | ICD-10-CM | POA: Diagnosis not present

## 2016-03-20 DIAGNOSIS — Z79899 Other long term (current) drug therapy: Secondary | ICD-10-CM

## 2016-03-20 MED ORDER — BUSPIRONE HCL 15 MG PO TABS: 15.0000 mg | ORAL_TABLET | Freq: Every day | ORAL | 1 refills | Status: AC

## 2016-03-20 MED ORDER — ARIPIPRAZOLE 10 MG PO TABS: 10.0000 mg | ORAL_TABLET | Freq: Every day | ORAL | 1 refills | Status: AC

## 2016-03-20 MED ORDER — QUETIAPINE FUMARATE 300 MG PO TABS: 300.0000 mg | ORAL_TABLET | Freq: Every day | ORAL | 1 refills | Status: AC

## 2016-03-20 NOTE — Progress Notes (Signed)
[facility] Outpatient Follow up visit  Patient Identification: Courtney SableDawn L Schroeder MRN:  [id number] Date of Evaluation:  03/20/2016 Referral Source: Primary care Chief Complaint:   Chief Complaint    Follow-up     Visit Diagnosis:    ICD-9-CM ICD-10-CM   1. Bipolar I disorder, most recent episode depressed (HCC) 296.50 F31.30   2. GAD (generalized anxiety disorder) 300.02 F41.1   3. Adjustment insomnia 307.41 F51.02     History of Present Illness:  56 years old currently single Caucasian female initially referred by primary care physician for management of bipolar and anxiety disorder   Patient was doing reasonable to a month ago but recently her brother is in the hospital her sister also underlie support. Brother had a stroke. This is affecting her they are out of town so she keeps in touch with phone calls but overall stressed and down but she feels the medication doing what it can. She takes Seroquel does help for sleep at night and Abilify during the day ongoing some tremors with the tremors have not worsened  She does not want to increase the medication feels that circumstances are making her upset but she is trying to keep up with the family and be supportive Insomnia has gone worse for the current above reason but overall she does have the support of her daughter.  Aggravating factors; loneliness sickness of her brother and sister including recent near death condition of her sister.  Modifying factor: her own daughers Seizures are treated by lamictal by her other provider  She is not on klonopine anymore buspar is helping the anxiety dose was adjusted last visit.    Severity of depression: 6/10.  Severity of anxiety: 5/10     Past Psychiatric History: 2 admissions in 1997/98 for severe depression .was going thru divorce  Family Psychiatric History: Father and sister : bipolar   Social History:   Social History   Social History  . Marital status: Divorced    Spouse name: N/A   . Number of children: N/A  . Years of education: N/A   Social History Main Topics  . Smoking status: Current Some Day Smoker    Packs/day: 1.00    Years: 20.00    Types: Cigarettes  . Smokeless tobacco: Never Used  . Alcohol use No  . Drug use: No  . Sexual activity: Not Currently   Other Topics Concern  . None   Social History Narrative  . None      Allergies:   Allergies  Allergen Reactions  . Oxycodone Anxiety and Other (See Comments)    Pt states it makes her very hot and anxious/nervous.    Metabolic Disorder Labs: No results found for: HGBA1C, MPG No results found for: PROLACTIN No results found for: CHOL, TRIG, HDL, CHOLHDL, VLDL, LDLCALC   Current Medications: Current Outpatient Prescriptions  Medication Sig Dispense Refill  . albuterol (PROVENTIL) (2.5 MG/3ML) 0.083% nebulizer solution 2.5 mg.    . ARIPiprazole (ABILIFY) 10 MG tablet Take 1 tablet (10 mg total) by mouth daily. 30 tablet 1  . busPIRone (BUSPAR) 15 MG tablet Take 1 tablet (15 mg total) by mouth daily. 30 tablet 1  . HYDROcodone-acetaminophen (NORCO/VICODIN) 5-325 MG tablet Take by mouth.    . lamoTRIgine (LAMICTAL) 200 MG tablet Take 200 mg by mouth.    . levothyroxine (SYNTHROID, LEVOTHROID) 137 MCG tablet Take by mouth.    . pantoprazole (PROTONIX) 20 MG tablet Take 20 mg by mouth.    .Marland Kitchen  QUEtiapine (SEROQUEL) 300 MG tablet Take 1 tablet (300 mg total) by mouth at bedtime. 30 tablet 1  . simvastatin (ZOCOR) 80 MG tablet Take 80 mg by mouth.     No current facility-administered medications for this visit.       Psychiatric Specialty Exam: Review of Systems  Respiratory: Negative for cough.   Cardiovascular: Negative for palpitations.  Neurological: Positive for tremors.  Psychiatric/Behavioral: Negative for substance abuse and suicidal ideas. The patient is nervous/anxious.     Blood pressure 130/80, pulse 93, resp. rate 18, height 5\' 8"  (1.727 m), weight 264 lb (119.7 kg), SpO2 96  %.Body mass index is 40.14 kg/m.  General Appearance: Casual  Eye Contact:  Fair  Speech:  Normal Rate  Volume:  Decreased  Mood: stressed  Affect:  Constricted and somewhat down  Thought Process:  Goal Directed  Orientation:  Full (Time, Place, and Person)  Thought Content:  Rumination  Suicidal Thoughts:  No  Homicidal Thoughts:  No  Memory:  Immediate;   Fair Recent;   Fair  Judgement:  Fair  Insight:  Shallow  Psychomotor Activity:  Normal  Concentration:  Concentration: Fair and Attention Span: Fair  Recall:  [employer] of Knowledge:Fair  Language: Fair  Akathisia:  Negative but has some tremors   Handed:  Right  AIMS (if indicated):    Assets:  Desire for Improvement Social Support  ADL's:  Intact  Cognition: WNL  Sleep:  Fair     Treatment Plan Summary: Medication management and Plan as follows   Bipolar disorder; depressed:worsened due to her stress related to family and sister being in life support Provided supportive therapy. .she does not want to increase medications GAd: fluctuates and worsened. buspar hellps. Will continue . She does not want to increase   InsomniaL: . Reviewed sleep hygiene y more than 50% time spent in counseling and coordination of care. The tremors have not worsened. Her main stressors related to her family sickness. She says is handling it and does not wan to schedule for therapist.   FU 4 weeks. Prescriptions sent.     Thresa Ross, MD 3/5/20183:23 PM

## 2016-05-05 ENCOUNTER — Other Ambulatory Visit (HOSPITAL_COMMUNITY): Payer: Self-pay | Admitting: Psychiatry

## 2016-05-10 NOTE — Telephone Encounter (Signed)
Received fax from [facility] requesting a refills for Seroquel and Abilify. Per Dr. Gilmore Laroche, refill request is denied. Refills were sent to pharmacy on 03/20/16 with 1 additional refill for Seroquel and Abilify. Next refills are due on 05/20/16. Pt's next apt is schedule on 05/16/16. Nothing further is needed at this time.

## 2016-05-16 ENCOUNTER — Ambulatory Visit (HOSPITAL_COMMUNITY): Payer: Self-pay | Admitting: Psychiatry

## 2016-05-18 ENCOUNTER — Ambulatory Visit (HOSPITAL_COMMUNITY): Payer: Self-pay | Admitting: Psychiatry

## 2016-06-02 ENCOUNTER — Other Ambulatory Visit (HOSPITAL_COMMUNITY): Payer: Self-pay | Admitting: Psychiatry

## 2016-06-08 NOTE — Telephone Encounter (Signed)
[facility] fax [facility] requesting a refill for Abilify and Seroquel. Per Dr. Gilmore LarocheAkhtar, refill requests are denied. Pt will need to schedule an apt. Lvm informing pt to contact office to schedule an apt.

## 2018-09-02 ENCOUNTER — Ambulatory Visit (HOSPITAL_COMMUNITY): Payer: Medicaid Other | Admitting: Psychiatry

## 2018-09-02 ENCOUNTER — Other Ambulatory Visit: Payer: Self-pay

## 2018-11-29 ENCOUNTER — Emergency Department
Admission: EM | Admit: 2018-11-29 | Discharge: 2018-11-29 | Disposition: A | Discharge status: Home / Self Care | Payer: Self-pay | Source: Home / Self Care

## 2018-11-29 ENCOUNTER — Encounter: Payer: Self-pay | Admitting: Emergency Medicine

## 2018-11-29 ENCOUNTER — Other Ambulatory Visit: Payer: Self-pay

## 2018-11-29 DIAGNOSIS — S46212A Strain of muscle, fascia and tendon of other parts of biceps, left arm, initial encounter: Secondary | ICD-10-CM

## 2018-11-29 HISTORY — DX: Essential (primary) hypertension: I10

## 2018-11-29 HISTORY — DX: Gastro-esophageal reflux disease without esophagitis: K21.9

## 2018-11-29 HISTORY — DX: Unspecified convulsions: R56.9

## 2018-11-29 HISTORY — DX: Disorder of thyroid, unspecified: E07.9

## 2018-11-29 HISTORY — DX: Depression, unspecified: F32.A

## 2018-11-29 HISTORY — DX: Anxiety disorder, unspecified: F41.9

## 2018-11-29 HISTORY — DX: Dorsalgia, unspecified: M54.9

## 2018-11-29 MED ORDER — IBUPROFEN 600 MG PO TABS: 600.0000 mg | ORAL_TABLET | Freq: Four times a day (QID) | ORAL | 0 refills | Status: AC | PRN

## 2018-11-29 NOTE — Discharge Instructions (Signed)
°  You may take 500mg  acetaminophen every 4-6 hours or in combination with ibuprofen 400-600mg  every 6-8 hours as needed for pain and inflammation in addition to your prescribed pain medication, however, be aware of how much acetaminophen you are taking as that is already part of the pain medication (percocet) you have previously been prescribed.  You may take your percocet with the ibuprofen at the same time.  Please call to schedule a follow up with Sports Medicine in 1 week if not improving.  They may need to perform special imaging such as an ultrasound or MRI of the muscle.

## 2018-11-29 NOTE — ED Provider Notes (Signed)
[facility]    CSN: [id number] Arrival date & time: 11/29/18  6761      History   Chief Complaint Chief Complaint  Patient presents with  . Arm Pain    HPI [name] is a 58 y.o. female.   HPI [name] is a 58 y.o. female presenting to UC with c/o Left arm pain for 2 days.  Pt reports trying to push herself off her bed from a seated position with her Left hand slipped on the edge of the bed, causing pain.  Pain is most severe in upper arm, worse with certain movements. Hx of chronic back pain. She has tried her previously prescribed percocet w/o much relief. Pain is aching and sore, 4/10.  She is not sure if the pain is due to a muscle injury and she is not sure if she needs to be taking a different type of medication.  She sees a Writer but not an orthopedist.    Past Medical History:  Diagnosis Date  . Anxiety and depression   . Back pain   . GERD (gastroesophageal reflux disease)   . Hypertension   . Seizures (HCC)   . Thyroid disease     There are no active problems to display for this patient.   Past Surgical History:  Procedure Laterality Date  . ABDOMINAL HYSTERECTOMY    . APPENDECTOMY    . MEDIAL PARTIAL KNEE REPLACEMENT    . TONSILLECTOMY      OB History   No obstetric history on file.      Home Medications    Prior to Admission medications   Medication Sig Start Date End Date Taking? Authorizing Provider  carvedilol (COREG) 12.5 MG tablet Take 12.5 mg by mouth 2 (two) times daily with a meal.   Yes [provider]  losartan (COZAAR) 25 MG tablet Take 25 mg by mouth daily.   Yes [provider]  omeprazole (PRILOSEC) 20 MG capsule Take 20 mg by mouth daily.   Yes [provider]  oxyCODONE-Acetaminophen (PERCOCET PO) Take by mouth.   Yes [provider]  albuterol (PROVENTIL) (2.5 MG/3ML) 0.083% nebulizer solution 2.5 mg.    [provider]  ARIPiprazole (ABILIFY) 10 MG tablet  Take 1 tablet (10 mg total) by mouth daily. 03/20/16   Thresa Ross, MD  busPIRone (BUSPAR) 15 MG tablet Take 1 tablet (15 mg total) by mouth daily. 03/20/16   Thresa Ross, MD  HYDROcodone-acetaminophen (NORCO/VICODIN) 5-325 MG tablet Take by mouth. 03/14/15   [provider]  ibuprofen (ADVIL) 600 MG tablet Take 1 tablet (600 mg total) by mouth every 6 (six) hours as needed. 11/29/18   Lurene Shadow, PA-C  lamoTRIgine (LAMICTAL) 200 MG tablet Take 200 mg by mouth.    [provider]  levothyroxine (SYNTHROID, LEVOTHROID) 137 MCG tablet Take by mouth.    [provider]  pantoprazole (PROTONIX) 20 MG tablet Take 20 mg by mouth.    [provider]  QUEtiapine (SEROQUEL) 300 MG tablet Take 1 tablet (300 mg total) by mouth at bedtime. 03/20/16   Thresa Ross, MD  simvastatin (ZOCOR) 80 MG tablet Take 80 mg by mouth.    [provider]    Family History No family history on file.  Social History Social History   Tobacco Use  . Smoking status: Current Some Day Smoker    Packs/day: 1.00    Years: 20.00    Pack years: 20.00  Types: Cigarettes  . Smokeless tobacco: Never Used  Substance Use Topics  . Alcohol use: No  . Drug use: No     Allergies   Oxycodone   Review of Systems Review of Systems  Musculoskeletal: Positive for back pain (chronic) and myalgias. Negative for arthralgias.  Skin: Negative for color change and wound.  Neurological: Negative for weakness and numbness.     Physical Exam Triage Vital Signs ED Triage Vitals  Enc Vitals Group     BP 11/29/18 0838 (!) 160/90     Pulse Rate 11/29/18 0838 80     Resp 11/29/18 0838 18     Temp 11/29/18 0838 98.1 F (36.7 C)     Temp Source 11/29/18 0838 Oral     SpO2 11/29/18 0838 95 %     Weight 11/29/18 0839 275 lb (124.7 kg)     Height 11/29/18 0839 5\' 7"  (1.702 m)     Head Circumference --      Peak Flow --      Pain Score 11/29/18 0839 4     Pain Loc --      Pain  Edu? --      Excl. in GC? --    No data found.  Updated Vital Signs BP (!) 160/90 (BP Location: Right Wrist)   Pulse 80   Temp 98.1 F (36.7 C) (Oral)   Resp 18   Ht 5\' 7"  (1.702 m)   Wt 275 lb (124.7 kg)   SpO2 95%   BMI 43.07 kg/m   Visual Acuity Right Eye Distance:   Left Eye Distance:   Bilateral Distance:    Right Eye Near:   Left Eye Near:    Bilateral Near:     Physical Exam Vitals signs and nursing note reviewed.  Constitutional:      Appearance: Normal appearance. She is well-developed.  HENT:     Head: Normocephalic and atraumatic.  Neck:     Musculoskeletal: Normal range of motion.  Cardiovascular:     Rate and Rhythm: Normal rate and regular rhythm.     Pulses:          Radial pulses are 2+ on the left side.  Pulmonary:     Effort: Pulmonary effort is normal.  Musculoskeletal: Normal range of motion.        General: Tenderness present. No swelling.     Comments: Left arm: full ROM, no bony tenderness of shoulder, elbow, wrist or hand.  Mild tenderness of biceps and muscles of forearm.  5/5 strength in Left arm including grip strength.  Skin:    General: Skin is warm and dry.     Capillary Refill: Capillary refill takes less than 2 seconds.     Findings: No bruising or erythema.  Neurological:     Mental Status: She is alert and oriented to person, place, and time.     Sensory: No sensory deficit.  Psychiatric:        Behavior: Behavior normal.      UC Treatments / Results  Labs (all labs ordered are listed, but only abnormal results are displayed) Labs Reviewed - No data to display  EKG   Radiology No results found.  Procedures Procedures (including critical care time)  Medications Ordered in UC Medications - No data to display  Initial Impression / Assessment and Plan / UC Course  I have reviewed the triage vital signs and the nursing notes.  Pertinent labs & imaging results that were available  during my care of the patient  were reviewed by me and considered in my medical decision making (see chart for details).     Hx and exam c/w biceps muscle strain Ace wrap applied for comfort Will have pt start taking ibuprofen She may also try applying warm compresses as she has only tried cool compresses so far.  Encouraged f/u with sports medicine in 1 week if not improving.  Final Clinical Impressions(s) / UC Diagnoses   Final diagnoses:  Biceps strain, left, initial encounter     Discharge Instructions      You may take 500mg  acetaminophen every 4-6 hours or in combination with ibuprofen 400-600mg  every 6-8 hours as needed for pain and inflammation in addition to your prescribed pain medication, however, be aware of how much acetaminophen you are taking as that is already part of the pain medication (percocet) you have previously been prescribed.  You may take your percocet with the ibuprofen at the same time.  Please call to schedule a follow up with Sports Medicine in 1 week if not improving.  They may need to perform special imaging such as an ultrasound or MRI of the muscle.       ED Prescriptions    Medication Sig Dispense Auth. Provider   ibuprofen (ADVIL) 600 MG tablet Take 1 tablet (600 mg total) by mouth every 6 (six) hours as needed. 30 tablet Noe Gens, [place]     I have reviewed the PDMP during this encounter.   Noe Gens, [place] 11/29/18 [phone]

## 2018-11-29 NOTE — ED Triage Notes (Signed)
Patient slipped and caught herself before falling landing on left arm; now soreness from shoulder to wrist; took her percoset this morning for pain. Has had influenza vacc this season. Has not travelled outside of [place] past 4 weeks.

## 2020-05-16 DEATH — deceased
